# Patient Record
Sex: Female | Born: 1940 | Race: Black or African American | Hispanic: No | Smoking: Former smoker
Health system: Southern US, Community
[De-identification: ages and names within clinical notes are randomized; demographics above are authoritative.]

## PROBLEM LIST (undated history)

## (undated) DIAGNOSIS — M1990 Unspecified osteoarthritis, unspecified site: Secondary | ICD-10-CM

## (undated) DIAGNOSIS — D649 Anemia, unspecified: Secondary | ICD-10-CM

## (undated) DIAGNOSIS — N189 Chronic kidney disease, unspecified: Secondary | ICD-10-CM

## (undated) DIAGNOSIS — D472 Monoclonal gammopathy: Secondary | ICD-10-CM

## (undated) DIAGNOSIS — E785 Hyperlipidemia, unspecified: Secondary | ICD-10-CM

## (undated) DIAGNOSIS — N2581 Secondary hyperparathyroidism of renal origin: Secondary | ICD-10-CM

## (undated) DIAGNOSIS — M109 Gout, unspecified: Secondary | ICD-10-CM

## (undated) DIAGNOSIS — E669 Obesity, unspecified: Secondary | ICD-10-CM

## (undated) DIAGNOSIS — I10 Essential (primary) hypertension: Secondary | ICD-10-CM

## (undated) DIAGNOSIS — K219 Gastro-esophageal reflux disease without esophagitis: Secondary | ICD-10-CM

## (undated) DIAGNOSIS — E039 Hypothyroidism, unspecified: Secondary | ICD-10-CM

## (undated) HISTORY — DX: Unspecified osteoarthritis, unspecified site: M19.90

## (undated) HISTORY — DX: Chronic kidney disease, unspecified: N18.9

## (undated) HISTORY — DX: Gastro-esophageal reflux disease without esophagitis: K21.9

## (undated) HISTORY — PX: COLONOSCOPY W/ POLYPECTOMY: SHX1380

## (undated) HISTORY — DX: Anemia, unspecified: D64.9

## (undated) HISTORY — PX: BACK SURGERY: SHX140

## (undated) HISTORY — DX: Essential (primary) hypertension: I10

## (undated) HISTORY — DX: Obesity, unspecified: E66.9

## (undated) HISTORY — DX: Secondary hyperparathyroidism of renal origin: N25.81

## (undated) HISTORY — DX: Hypothyroidism, unspecified: E03.9

## (undated) HISTORY — DX: Gout, unspecified: M10.9

## (undated) HISTORY — DX: Monoclonal gammopathy: D47.2

## (undated) HISTORY — DX: Hyperlipidemia, unspecified: E78.5

---

## 2004-12-20 ENCOUNTER — Ambulatory Visit: Payer: Self-pay | Admitting: Oncology

## 2005-04-04 ENCOUNTER — Ambulatory Visit: Payer: Self-pay | Admitting: Oncology

## 2005-07-19 ENCOUNTER — Ambulatory Visit (HOSPITAL_COMMUNITY): Admission: RE | Admit: 2005-07-19 | Discharge: 2005-07-19 | Payer: Self-pay | Admitting: Internal Medicine

## 2005-09-09 ENCOUNTER — Ambulatory Visit: Payer: Self-pay | Admitting: Oncology

## 2005-09-11 LAB — CBC WITH DIFFERENTIAL/PLATELET
BASO%: 0.4 % (ref 0.0–2.0)
Basophils Absolute: 0 10*3/uL (ref 0.0–0.1)
Eosinophils Absolute: 0.4 10*3/uL (ref 0.0–0.5)
HCT: 28.9 % — ABNORMAL LOW (ref 34.8–46.6)
HGB: 9.8 g/dL — ABNORMAL LOW (ref 11.6–15.9)
LYMPH%: 34.3 % (ref 14.0–48.0)
MCHC: 34 g/dL (ref 32.0–36.0)
MONO#: 0.6 10*3/uL (ref 0.1–0.9)
NEUT#: 3.8 10*3/uL (ref 1.5–6.5)
NEUT%: 52 % (ref 39.6–76.8)
Platelets: 277 10*3/uL (ref 145–400)
WBC: 7.4 10*3/uL (ref 3.9–10.0)
lymph#: 2.5 10*3/uL (ref 0.9–3.3)

## 2005-11-25 ENCOUNTER — Ambulatory Visit: Payer: Self-pay | Admitting: Oncology

## 2005-11-27 LAB — CBC WITH DIFFERENTIAL/PLATELET
Basophils Absolute: 0 10*3/uL (ref 0.0–0.1)
EOS%: 5.9 % (ref 0.0–7.0)
HCT: 31.4 % — ABNORMAL LOW (ref 34.8–46.6)
HGB: 10.6 g/dL — ABNORMAL LOW (ref 11.6–15.9)
LYMPH%: 36.4 % (ref 14.0–48.0)
MCH: 31.3 pg (ref 26.0–34.0)
MCV: 93.2 fL (ref 81.0–101.0)
MONO%: 8.4 % (ref 0.0–13.0)
NEUT%: 49 % (ref 39.6–76.8)

## 2005-12-02 LAB — COMPREHENSIVE METABOLIC PANEL
ALT: 21 U/L (ref 0–40)
CO2: 26 mEq/L (ref 19–32)
Calcium: 9.5 mg/dL (ref 8.4–10.5)
Chloride: 107 mEq/L (ref 96–112)
Creatinine, Ser: 2.05 mg/dL — ABNORMAL HIGH (ref 0.40–1.20)
Glucose, Bld: 116 mg/dL — ABNORMAL HIGH (ref 70–99)
Sodium: 144 mEq/L (ref 135–145)
Total Bilirubin: 0.4 mg/dL (ref 0.3–1.2)
Total Protein: 7.9 g/dL (ref 6.0–8.3)

## 2005-12-02 LAB — LACTATE DEHYDROGENASE: LDH: 213 U/L (ref 94–250)

## 2005-12-02 LAB — SPEP & IFE WITH QIG
Albumin ELP: 52.7 % — ABNORMAL LOW (ref 55.8–66.1)
Alpha-1-Globulin: 4.2 % (ref 2.9–4.9)
Alpha-2-Globulin: 12.4 % — ABNORMAL HIGH (ref 7.1–11.8)
Beta 2: 4.4 % (ref 3.2–6.5)
Beta Globulin: 5.5 % (ref 4.7–7.2)
Gamma Globulin: 20.8 % — ABNORMAL HIGH (ref 11.1–18.8)
IgM, Serum: 147 mg/dL (ref 60–263)

## 2005-12-02 LAB — BETA 2 MICROGLOBULIN, SERUM: Beta-2 Microglobulin: 2.51 mg/L — ABNORMAL HIGH (ref 1.01–1.73)

## 2005-12-02 LAB — KAPPA/LAMBDA LIGHT CHAINS: Kappa:Lambda Ratio: 0.93 (ref 0.26–1.65)

## 2006-05-29 ENCOUNTER — Ambulatory Visit: Payer: Self-pay | Admitting: Oncology

## 2006-06-03 LAB — CBC WITH DIFFERENTIAL/PLATELET
Basophils Absolute: 0 10*3/uL (ref 0.0–0.1)
EOS%: 7.2 % — ABNORMAL HIGH (ref 0.0–7.0)
HCT: 32 % — ABNORMAL LOW (ref 34.8–46.6)
HGB: 11 g/dL — ABNORMAL LOW (ref 11.6–15.9)
LYMPH%: 38.4 % (ref 14.0–48.0)
MCH: 31.2 pg (ref 26.0–34.0)
MCHC: 34.2 g/dL (ref 32.0–36.0)
NEUT%: 46.1 % (ref 39.6–76.8)
Platelets: 255 10*3/uL (ref 145–400)
lymph#: 2.5 10*3/uL (ref 0.9–3.3)

## 2006-06-05 LAB — COMPREHENSIVE METABOLIC PANEL
AST: 24 U/L (ref 0–37)
BUN: 39 mg/dL — ABNORMAL HIGH (ref 6–23)
CO2: 23 mEq/L (ref 19–32)
Calcium: 9.7 mg/dL (ref 8.4–10.5)
Chloride: 106 mEq/L (ref 96–112)
Creatinine, Ser: 2.11 mg/dL — ABNORMAL HIGH (ref 0.40–1.20)
Total Bilirubin: 0.4 mg/dL (ref 0.3–1.2)

## 2006-06-05 LAB — SPEP & IFE WITH QIG
Gamma Globulin: 21.4 % — ABNORMAL HIGH (ref 11.1–18.8)
IgG (Immunoglobin G), Serum: 1790 mg/dL — ABNORMAL HIGH (ref 694–1618)
IgM, Serum: 149 mg/dL (ref 60–263)
M-Spike, %: 0.85 g/dL
Total Protein, Serum Electrophoresis: 8.1 g/dL (ref 6.0–8.3)

## 2006-10-03 ENCOUNTER — Ambulatory Visit: Payer: Self-pay | Admitting: Oncology

## 2006-10-08 LAB — CBC WITH DIFFERENTIAL/PLATELET
BASO%: 0.5 % (ref 0.0–2.0)
LYMPH%: 29.6 % (ref 14.0–48.0)
MCHC: 34.1 g/dL (ref 32.0–36.0)
MONO#: 0.5 10*3/uL (ref 0.1–0.9)
RBC: 3.2 10*6/uL — ABNORMAL LOW (ref 3.70–5.32)
RDW: 13.9 % (ref 11.3–14.5)
WBC: 7.2 10*3/uL (ref 3.9–10.0)
lymph#: 2.1 10*3/uL (ref 0.9–3.3)

## 2006-10-10 LAB — COMPREHENSIVE METABOLIC PANEL
ALT: 18 U/L (ref 0–35)
CO2: 24 mEq/L (ref 19–32)
Chloride: 110 mEq/L (ref 96–112)
Potassium: 4.7 mEq/L (ref 3.5–5.3)
Sodium: 143 mEq/L (ref 135–145)
Total Bilirubin: 0.5 mg/dL (ref 0.3–1.2)
Total Protein: 7.8 g/dL (ref 6.0–8.3)

## 2006-10-10 LAB — SPEP & IFE WITH QIG
Albumin ELP: 53.4 % — ABNORMAL LOW (ref 55.8–66.1)
Alpha-1-Globulin: 4 % (ref 2.9–4.9)
Alpha-2-Globulin: 12.1 % — ABNORMAL HIGH (ref 7.1–11.8)
Beta 2: 4.6 % (ref 3.2–6.5)
Beta Globulin: 5 % (ref 4.7–7.2)
Gamma Globulin: 20.9 % — ABNORMAL HIGH (ref 11.1–18.8)

## 2006-10-10 LAB — IRON AND TIBC
%SAT: 26 % (ref 20–55)
Iron: 73 ug/dL (ref 42–145)
TIBC: 285 ug/dL (ref 250–470)
UIBC: 212 ug/dL

## 2006-10-10 LAB — LACTATE DEHYDROGENASE: LDH: 201 U/L (ref 94–250)

## 2006-12-19 ENCOUNTER — Ambulatory Visit (HOSPITAL_COMMUNITY): Admission: RE | Admit: 2006-12-19 | Discharge: 2006-12-19 | Payer: Self-pay | Admitting: Internal Medicine

## 2007-05-19 ENCOUNTER — Ambulatory Visit: Payer: Self-pay | Admitting: Oncology

## 2007-05-21 LAB — COMPREHENSIVE METABOLIC PANEL
ALT: 17 U/L (ref 0–35)
AST: 21 U/L (ref 0–37)
Alkaline Phosphatase: 57 U/L (ref 39–117)
BUN: 41 mg/dL — ABNORMAL HIGH (ref 6–23)
Creatinine, Ser: 2.79 mg/dL — ABNORMAL HIGH (ref 0.40–1.20)
Potassium: 4.8 mEq/L (ref 3.5–5.3)

## 2007-05-21 LAB — CBC WITH DIFFERENTIAL/PLATELET
BASO%: 0.5 % (ref 0.0–2.0)
Basophils Absolute: 0 10*3/uL (ref 0.0–0.1)
EOS%: 4.2 % (ref 0.0–7.0)
HGB: 10.9 g/dL — ABNORMAL LOW (ref 11.6–15.9)
MCH: 32.6 pg (ref 26.0–34.0)
MCHC: 34.7 g/dL (ref 32.0–36.0)
MCV: 94 fL (ref 81.0–101.0)
MONO%: 7.2 % (ref 0.0–13.0)
RDW: 14.1 % (ref 11.3–14.5)

## 2007-11-18 ENCOUNTER — Ambulatory Visit: Payer: Self-pay | Admitting: Oncology

## 2007-11-24 LAB — CBC WITH DIFFERENTIAL/PLATELET
BASO%: 0.2 % (ref 0.0–2.0)
EOS%: 6.2 % (ref 0.0–7.0)
HCT: 30 % — ABNORMAL LOW (ref 34.8–46.6)
MCH: 32.5 pg (ref 26.0–34.0)
MCHC: 34 g/dL (ref 32.0–36.0)
MONO#: 0.5 10*3/uL (ref 0.1–0.9)
NEUT%: 50.1 % (ref 39.6–76.8)
RBC: 3.14 10*6/uL — ABNORMAL LOW (ref 3.70–5.32)
WBC: 6.5 10*3/uL (ref 3.9–10.0)
lymph#: 2.3 10*3/uL (ref 0.9–3.3)

## 2007-11-26 LAB — COMPREHENSIVE METABOLIC PANEL
ALT: 21 U/L (ref 0–35)
AST: 21 U/L (ref 0–37)
CO2: 23 mEq/L (ref 19–32)
Calcium: 10 mg/dL (ref 8.4–10.5)
Chloride: 109 mEq/L (ref 96–112)
Creatinine, Ser: 2.67 mg/dL — ABNORMAL HIGH (ref 0.40–1.20)
Sodium: 145 mEq/L (ref 135–145)
Total Bilirubin: 0.5 mg/dL (ref 0.3–1.2)
Total Protein: 8 g/dL (ref 6.0–8.3)

## 2007-11-26 LAB — SPEP & IFE WITH QIG
Albumin ELP: 54.5 % — ABNORMAL LOW (ref 55.8–66.1)
Beta 2: 4.7 % (ref 3.2–6.5)
Beta Globulin: 5 % (ref 4.7–7.2)
Gamma Globulin: 20.9 % — ABNORMAL HIGH (ref 11.1–18.8)
IgA: 134 mg/dL (ref 68–378)
IgG (Immunoglobin G), Serum: 1830 mg/dL — ABNORMAL HIGH (ref 694–1618)
IgM, Serum: 138 mg/dL (ref 60–263)
M-Spike, %: 0.82 g/dL

## 2007-11-26 LAB — KAPPA/LAMBDA LIGHT CHAINS
Kappa free light chain: 3.2 mg/dL — ABNORMAL HIGH (ref 0.33–1.94)
Kappa:Lambda Ratio: 0.3 (ref 0.26–1.65)
Lambda Free Lght Chn: 10.7 mg/dL — ABNORMAL HIGH (ref 0.57–2.63)

## 2007-11-26 LAB — IRON AND TIBC: %SAT: 29 % (ref 20–55)

## 2008-01-14 ENCOUNTER — Ambulatory Visit (HOSPITAL_COMMUNITY): Admission: RE | Admit: 2008-01-14 | Discharge: 2008-01-14 | Payer: Self-pay | Admitting: Internal Medicine

## 2008-05-20 ENCOUNTER — Ambulatory Visit: Payer: Self-pay | Admitting: Oncology

## 2008-05-24 LAB — CBC WITH DIFFERENTIAL/PLATELET
BASO%: 0.4 % (ref 0.0–2.0)
Basophils Absolute: 0 10*3/uL (ref 0.0–0.1)
HCT: 29.8 % — ABNORMAL LOW (ref 34.8–46.6)
HGB: 9.9 g/dL — ABNORMAL LOW (ref 11.6–15.9)
LYMPH%: 29.5 % (ref 14.0–49.7)
MCH: 31.8 pg (ref 25.1–34.0)
MCHC: 33.2 g/dL (ref 31.5–36.0)
MONO#: 0.7 10*3/uL (ref 0.1–0.9)
NEUT%: 57 % (ref 38.4–76.8)
Platelets: 231 10*3/uL (ref 145–400)
WBC: 8.7 10*3/uL (ref 3.9–10.3)

## 2008-05-26 LAB — COMPREHENSIVE METABOLIC PANEL
ALT: 24 U/L (ref 0–35)
BUN: 56 mg/dL — ABNORMAL HIGH (ref 6–23)
CO2: 25 mEq/L (ref 19–32)
Calcium: 10 mg/dL (ref 8.4–10.5)
Creatinine, Ser: 3.16 mg/dL — ABNORMAL HIGH (ref 0.40–1.20)
Total Bilirubin: 0.3 mg/dL (ref 0.3–1.2)

## 2008-05-26 LAB — SPEP & IFE WITH QIG
Alpha-1-Globulin: 4 % (ref 2.9–4.9)
Beta 2: 4.3 % (ref 3.2–6.5)
Gamma Globulin: 21 % — ABNORMAL HIGH (ref 11.1–18.8)
IgA: 126 mg/dL (ref 68–378)
IgG (Immunoglobin G), Serum: 1690 mg/dL — ABNORMAL HIGH (ref 694–1618)
IgM, Serum: 133 mg/dL (ref 60–263)
M-Spike, %: 0.87 g/dL

## 2008-05-26 LAB — KAPPA/LAMBDA LIGHT CHAINS
Kappa:Lambda Ratio: 0.32 (ref 0.26–1.65)
Lambda Free Lght Chn: 11.8 mg/dL — ABNORMAL HIGH (ref 0.57–2.63)

## 2008-08-25 ENCOUNTER — Ambulatory Visit: Payer: Self-pay | Admitting: Oncology

## 2008-08-30 ENCOUNTER — Ambulatory Visit (HOSPITAL_COMMUNITY): Admission: RE | Admit: 2008-08-30 | Discharge: 2008-08-30 | Payer: Self-pay | Admitting: Oncology

## 2008-08-30 LAB — CBC WITH DIFFERENTIAL/PLATELET
Basophils Absolute: 0 10*3/uL (ref 0.0–0.1)
Eosinophils Absolute: 0.4 10*3/uL (ref 0.0–0.5)
HGB: 9.9 g/dL — ABNORMAL LOW (ref 11.6–15.9)
MCV: 93.8 fL (ref 79.5–101.0)
NEUT#: 4.4 10*3/uL (ref 1.5–6.5)
RDW: 13.5 % (ref 11.2–14.5)
lymph#: 2.8 10*3/uL (ref 0.9–3.3)

## 2008-09-01 LAB — KAPPA/LAMBDA LIGHT CHAINS
Kappa:Lambda Ratio: 0.39 (ref 0.26–1.65)
Lambda Free Lght Chn: 9.23 mg/dL — ABNORMAL HIGH (ref 0.57–2.63)

## 2008-09-01 LAB — COMPREHENSIVE METABOLIC PANEL
Albumin: 4.1 g/dL (ref 3.5–5.2)
BUN: 40 mg/dL — ABNORMAL HIGH (ref 6–23)
CO2: 27 mEq/L (ref 19–32)
Calcium: 9.5 mg/dL (ref 8.4–10.5)
Chloride: 106 mEq/L (ref 96–112)
Glucose, Bld: 108 mg/dL — ABNORMAL HIGH (ref 70–99)
Potassium: 4.4 mEq/L (ref 3.5–5.3)

## 2008-09-01 LAB — SPEP & IFE WITH QIG
Alpha-1-Globulin: 4.5 % (ref 2.9–4.9)
Alpha-2-Globulin: 12.8 % — ABNORMAL HIGH (ref 7.1–11.8)
Beta 2: 3.9 % (ref 3.2–6.5)
Gamma Globulin: 20.1 % — ABNORMAL HIGH (ref 11.1–18.8)

## 2008-09-12 ENCOUNTER — Ambulatory Visit (HOSPITAL_COMMUNITY): Admission: RE | Admit: 2008-09-12 | Discharge: 2008-09-12 | Payer: Self-pay | Admitting: Oncology

## 2008-09-12 ENCOUNTER — Encounter: Payer: Self-pay | Admitting: Oncology

## 2008-09-12 ENCOUNTER — Ambulatory Visit: Payer: Self-pay | Admitting: Oncology

## 2008-09-20 LAB — COMPREHENSIVE METABOLIC PANEL
ALT: 27 U/L (ref 0–35)
Albumin: 4.5 g/dL (ref 3.5–5.2)
Alkaline Phosphatase: 63 U/L (ref 39–117)
CO2: 23 mEq/L (ref 19–32)
Glucose, Bld: 114 mg/dL — ABNORMAL HIGH (ref 70–99)
Potassium: 4.2 mEq/L (ref 3.5–5.3)
Sodium: 143 mEq/L (ref 135–145)
Total Bilirubin: 0.4 mg/dL (ref 0.3–1.2)
Total Protein: 8 g/dL (ref 6.0–8.3)

## 2008-09-20 LAB — CBC WITH DIFFERENTIAL/PLATELET
BASO%: 0.5 % (ref 0.0–2.0)
Eosinophils Absolute: 0.3 10*3/uL (ref 0.0–0.5)
LYMPH%: 32.2 % (ref 14.0–49.7)
MCHC: 33.2 g/dL (ref 31.5–36.0)
MONO#: 0.5 10*3/uL (ref 0.1–0.9)
MONO%: 7.9 % (ref 0.0–14.0)
NEUT#: 3.7 10*3/uL (ref 1.5–6.5)
RBC: 3.36 10*6/uL — ABNORMAL LOW (ref 3.70–5.45)
RDW: 13.9 % (ref 11.2–14.5)
WBC: 6.7 10*3/uL (ref 3.9–10.3)

## 2008-10-14 ENCOUNTER — Ambulatory Visit: Payer: Self-pay | Admitting: Oncology

## 2008-10-18 LAB — CBC WITH DIFFERENTIAL/PLATELET
Basophils Absolute: 0 10*3/uL (ref 0.0–0.1)
EOS%: 5.4 % (ref 0.0–7.0)
Eosinophils Absolute: 0.4 10*3/uL (ref 0.0–0.5)
HGB: 11.4 g/dL — ABNORMAL LOW (ref 11.6–15.9)
LYMPH%: 33.8 % (ref 14.0–49.7)
MCH: 31.5 pg (ref 25.1–34.0)
MCV: 94.9 fL (ref 79.5–101.0)
MONO%: 7.2 % (ref 0.0–14.0)
NEUT%: 52.9 % (ref 38.4–76.8)
Platelets: 259 10*3/uL (ref 145–400)
RDW: 15 % — ABNORMAL HIGH (ref 11.2–14.5)

## 2008-10-20 LAB — SPEP & IFE WITH QIG
Albumin ELP: 54.1 % — ABNORMAL LOW (ref 55.8–66.1)
Alpha-2-Globulin: 11.3 % (ref 7.1–11.8)
Beta 2: 4.5 % (ref 3.2–6.5)
Beta Globulin: 5 % (ref 4.7–7.2)
IgA: 131 mg/dL (ref 68–378)
IgG (Immunoglobin G), Serum: 1770 mg/dL — ABNORMAL HIGH (ref 694–1618)

## 2008-10-20 LAB — KAPPA/LAMBDA LIGHT CHAINS
Kappa free light chain: 2.86 mg/dL — ABNORMAL HIGH (ref 0.33–1.94)
Lambda Free Lght Chn: 9.59 mg/dL — ABNORMAL HIGH (ref 0.57–2.63)

## 2008-11-11 ENCOUNTER — Ambulatory Visit: Payer: Self-pay | Admitting: Oncology

## 2008-11-15 LAB — CBC WITH DIFFERENTIAL/PLATELET
BASO%: 0.4 % (ref 0.0–2.0)
EOS%: 5.6 % (ref 0.0–7.0)
LYMPH%: 41.7 % (ref 14.0–49.7)
MCH: 31.2 pg (ref 25.1–34.0)
MCHC: 33.2 g/dL (ref 31.5–36.0)
MCV: 94.1 fL (ref 79.5–101.0)
MONO%: 8 % (ref 0.0–14.0)
NEUT#: 3.4 10*3/uL (ref 1.5–6.5)
Platelets: 236 10*3/uL (ref 145–400)
RBC: 4.02 10*6/uL (ref 3.70–5.45)
RDW: 15.9 % — ABNORMAL HIGH (ref 11.2–14.5)

## 2008-12-13 ENCOUNTER — Ambulatory Visit: Payer: Self-pay | Admitting: Oncology

## 2008-12-13 LAB — CBC WITH DIFFERENTIAL/PLATELET
EOS%: 6.2 % (ref 0.0–7.0)
MCH: 31.3 pg (ref 25.1–34.0)
MCHC: 33.6 g/dL (ref 31.5–36.0)
MCV: 93.1 fL (ref 79.5–101.0)
MONO%: 8.4 % (ref 0.0–14.0)
RBC: 3.68 10*6/uL — ABNORMAL LOW (ref 3.70–5.45)
RDW: 16.5 % — ABNORMAL HIGH (ref 11.2–14.5)

## 2009-01-06 ENCOUNTER — Ambulatory Visit: Payer: Self-pay | Admitting: Oncology

## 2009-01-10 LAB — CBC WITH DIFFERENTIAL/PLATELET
Basophils Absolute: 0 10*3/uL (ref 0.0–0.1)
EOS%: 5.9 % (ref 0.0–7.0)
HCT: 37 % (ref 34.8–46.6)
HGB: 12.4 g/dL (ref 11.6–15.9)
MCH: 31.5 pg (ref 25.1–34.0)
MCV: 94.3 fL (ref 79.5–101.0)
MONO%: 8.1 % (ref 0.0–14.0)
NEUT%: 46 % (ref 38.4–76.8)
RDW: 17.5 % — ABNORMAL HIGH (ref 11.2–14.5)

## 2009-02-02 ENCOUNTER — Ambulatory Visit: Payer: Self-pay | Admitting: Oncology

## 2009-02-07 LAB — CBC WITH DIFFERENTIAL/PLATELET
Basophils Absolute: 0 10*3/uL (ref 0.0–0.1)
HCT: 33.4 % — ABNORMAL LOW (ref 34.8–46.6)
HGB: 11.1 g/dL — ABNORMAL LOW (ref 11.6–15.9)
MCHC: 33.2 g/dL (ref 31.5–36.0)
MCV: 95.1 fL (ref 79.5–101.0)
MONO#: 0.5 10*3/uL (ref 0.1–0.9)
NEUT#: 3.7 10*3/uL (ref 1.5–6.5)
RBC: 3.51 10*6/uL — ABNORMAL LOW (ref 3.70–5.45)
RDW: 17.1 % — ABNORMAL HIGH (ref 11.2–14.5)

## 2009-02-10 LAB — SPEP & IFE WITH QIG
Albumin ELP: 54 % — ABNORMAL LOW (ref 55.8–66.1)
Beta 2: 4.4 % (ref 3.2–6.5)
Beta Globulin: 4.7 % (ref 4.7–7.2)
Gamma Globulin: 20.3 % — ABNORMAL HIGH (ref 11.1–18.8)
IgA: 125 mg/dL (ref 68–378)

## 2009-02-10 LAB — COMPREHENSIVE METABOLIC PANEL
BUN: 34 mg/dL — ABNORMAL HIGH (ref 6–23)
CO2: 27 mEq/L (ref 19–32)
Creatinine, Ser: 2.39 mg/dL — ABNORMAL HIGH (ref 0.40–1.20)
Glucose, Bld: 115 mg/dL — ABNORMAL HIGH (ref 70–99)
Sodium: 145 mEq/L (ref 135–145)
Total Bilirubin: 0.4 mg/dL (ref 0.3–1.2)
Total Protein: 7.7 g/dL (ref 6.0–8.3)

## 2009-02-10 LAB — KAPPA/LAMBDA LIGHT CHAINS
Kappa free light chain: 3.11 mg/dL — ABNORMAL HIGH (ref 0.33–1.94)
Kappa:Lambda Ratio: 0.29 (ref 0.26–1.65)
Lambda Free Lght Chn: 10.8 mg/dL — ABNORMAL HIGH (ref 0.57–2.63)

## 2009-06-05 ENCOUNTER — Ambulatory Visit: Payer: Self-pay | Admitting: Oncology

## 2009-06-07 LAB — COMPREHENSIVE METABOLIC PANEL
Albumin: 4.5 g/dL (ref 3.5–5.2)
BUN: 35 mg/dL — ABNORMAL HIGH (ref 6–23)
Calcium: 10.3 mg/dL (ref 8.4–10.5)
Chloride: 103 mEq/L (ref 96–112)
Glucose, Bld: 115 mg/dL — ABNORMAL HIGH (ref 70–99)

## 2009-06-07 LAB — CBC WITH DIFFERENTIAL/PLATELET
BASO%: 0.9 % (ref 0.0–2.0)
Basophils Absolute: 0.1 10*3/uL (ref 0.0–0.1)
Eosinophils Absolute: 0.5 10*3/uL (ref 0.0–0.5)
LYMPH%: 40.2 % (ref 14.0–49.7)
MCV: 96.6 fL (ref 79.5–101.0)
MONO#: 0.7 10*3/uL (ref 0.1–0.9)
MONO%: 7.7 % (ref 0.0–14.0)
NEUT%: 45.9 % (ref 38.4–76.8)
RBC: 3.32 10*6/uL — ABNORMAL LOW (ref 3.70–5.45)
RDW: 14.5 % (ref 11.2–14.5)
lymph#: 3.5 10*3/uL — ABNORMAL HIGH (ref 0.9–3.3)

## 2009-08-10 ENCOUNTER — Ambulatory Visit: Payer: Self-pay | Admitting: Oncology

## 2009-08-11 LAB — CBC WITH DIFFERENTIAL/PLATELET
BASO%: 1.8 % (ref 0.0–2.0)
Basophils Absolute: 0.2 10*3/uL — ABNORMAL HIGH (ref 0.0–0.1)
Platelets: 246 10*3/uL (ref 145–400)

## 2009-08-16 LAB — COMPREHENSIVE METABOLIC PANEL
ALT: 24 U/L (ref 0–35)
AST: 26 U/L (ref 0–37)
Albumin: 4.4 g/dL (ref 3.5–5.2)
BUN: 49 mg/dL — ABNORMAL HIGH (ref 6–23)
CO2: 24 mEq/L (ref 19–32)
Calcium: 10.4 mg/dL (ref 8.4–10.5)
Chloride: 104 mEq/L (ref 96–112)
Potassium: 4.3 mEq/L (ref 3.5–5.3)
Sodium: 141 mEq/L (ref 135–145)
Total Bilirubin: 0.4 mg/dL (ref 0.3–1.2)
Total Protein: 7.5 g/dL (ref 6.0–8.3)

## 2009-08-16 LAB — SPEP & IFE WITH QIG
Alpha-1-Globulin: 4.1 % (ref 2.9–4.9)
Beta 2: 3.6 % (ref 3.2–6.5)
Gamma Globulin: 20.6 % — ABNORMAL HIGH (ref 11.1–18.8)
IgA: 118 mg/dL (ref 68–378)
IgM, Serum: 126 mg/dL (ref 60–263)
Total Protein, Serum Electrophoresis: 7.5 g/dL (ref 6.0–8.3)

## 2010-01-12 ENCOUNTER — Ambulatory Visit: Payer: Self-pay | Admitting: Oncology

## 2010-01-18 LAB — CBC WITH DIFFERENTIAL/PLATELET
BASO%: 1 % (ref 0.0–2.0)
Basophils Absolute: 0.1 10*3/uL (ref 0.0–0.1)
Eosinophils Absolute: 0.4 10*3/uL (ref 0.0–0.5)
HCT: 31.2 % — ABNORMAL LOW (ref 34.8–46.6)
HGB: 10.8 g/dL — ABNORMAL LOW (ref 11.6–15.9)
LYMPH%: 32 % (ref 14.0–49.7)
MONO#: 0.4 10*3/uL (ref 0.1–0.9)
MONO%: 5.5 % (ref 0.0–14.0)
NEUT#: 4.2 10*3/uL (ref 1.5–6.5)
WBC: 7.4 10*3/uL (ref 3.9–10.3)

## 2010-01-22 LAB — SPEP & IFE WITH QIG
Albumin ELP: 52.6 % — ABNORMAL LOW (ref 55.8–66.1)
Alpha-1-Globulin: 4.3 % (ref 2.9–4.9)
IgA: 122 mg/dL (ref 68–378)
IgM, Serum: 121 mg/dL (ref 60–263)
M-Spike, %: 1.04 g/dL
Total Protein, Serum Electrophoresis: 8.7 g/dL — ABNORMAL HIGH (ref 6.0–8.3)

## 2010-01-22 LAB — KAPPA/LAMBDA LIGHT CHAINS: Kappa free light chain: 3.77 mg/dL — ABNORMAL HIGH (ref 0.33–1.94)

## 2010-01-22 LAB — COMPREHENSIVE METABOLIC PANEL
ALT: 23 U/L (ref 0–35)
AST: 27 U/L (ref 0–37)
Albumin: 5.2 g/dL (ref 3.5–5.2)
Calcium: 10.4 mg/dL (ref 8.4–10.5)
Potassium: 4.8 mEq/L (ref 3.5–5.3)
Sodium: 142 mEq/L (ref 135–145)

## 2010-03-22 ENCOUNTER — Ambulatory Visit (HOSPITAL_COMMUNITY)
Admission: RE | Admit: 2010-03-22 | Discharge: 2010-03-22 | Payer: Self-pay | Source: Home / Self Care | Attending: Internal Medicine | Admitting: Internal Medicine

## 2010-04-08 ENCOUNTER — Encounter: Payer: Self-pay | Admitting: Internal Medicine

## 2010-06-25 LAB — DIFFERENTIAL
Basophils Relative: 0 % (ref 0–1)
Lymphs Abs: 2.1 10*3/uL (ref 0.7–4.0)
Monocytes Relative: 8 % (ref 3–12)
Neutrophils Relative %: 60 % (ref 43–77)

## 2010-06-25 LAB — CHROMOSOME ANALYSIS, BONE MARROW

## 2010-06-25 LAB — BONE MARROW EXAM

## 2010-06-25 LAB — CBC
Hemoglobin: 10.1 g/dL — ABNORMAL LOW (ref 12.0–15.0)
MCV: 96.7 fL (ref 78.0–100.0)
Platelets: 223 10*3/uL (ref 150–400)

## 2010-07-19 ENCOUNTER — Other Ambulatory Visit: Payer: Self-pay | Admitting: Medical

## 2010-07-19 ENCOUNTER — Encounter (HOSPITAL_BASED_OUTPATIENT_CLINIC_OR_DEPARTMENT_OTHER): Payer: Medicare Other | Admitting: Oncology

## 2010-07-19 DIAGNOSIS — D649 Anemia, unspecified: Secondary | ICD-10-CM

## 2010-07-19 DIAGNOSIS — N189 Chronic kidney disease, unspecified: Secondary | ICD-10-CM

## 2010-07-19 DIAGNOSIS — N289 Disorder of kidney and ureter, unspecified: Secondary | ICD-10-CM

## 2010-07-19 DIAGNOSIS — D472 Monoclonal gammopathy: Secondary | ICD-10-CM

## 2010-07-19 LAB — CBC WITH DIFFERENTIAL/PLATELET
EOS%: 4.9 % (ref 0.0–7.0)
Eosinophils Absolute: 0.4 10*3/uL (ref 0.0–0.5)
HCT: 27.5 % — ABNORMAL LOW (ref 34.8–46.6)
MCHC: 33.9 g/dL (ref 31.5–36.0)
MONO#: 0.6 10*3/uL (ref 0.1–0.9)
NEUT%: 56.2 % (ref 38.4–76.8)
RDW: 13.6 % (ref 11.2–14.5)
lymph#: 2.6 10*3/uL (ref 0.9–3.3)

## 2010-07-23 LAB — COMPREHENSIVE METABOLIC PANEL
ALT: 18 U/L (ref 0–35)
AST: 22 U/L (ref 0–37)
Calcium: 10.1 mg/dL (ref 8.4–10.5)
Sodium: 144 mEq/L (ref 135–145)
Total Protein: 7.9 g/dL (ref 6.0–8.3)

## 2010-07-23 LAB — KAPPA/LAMBDA LIGHT CHAINS
Kappa free light chain: 3.92 mg/dL — ABNORMAL HIGH (ref 0.33–1.94)
Kappa:Lambda Ratio: 0.27 (ref 0.26–1.65)
Lambda Free Lght Chn: 14.5 mg/dL — ABNORMAL HIGH (ref 0.57–2.63)

## 2010-07-23 LAB — SPEP & IFE WITH QIG
Alpha-1-Globulin: 4 % (ref 2.9–4.9)
Beta Globulin: 4.7 % (ref 4.7–7.2)
IgA: 108 mg/dL (ref 68–378)
IgG (Immunoglobin G), Serum: 1870 mg/dL — ABNORMAL HIGH (ref 694–1618)
IgM, Serum: 109 mg/dL (ref 60–263)
Total Protein, Serum Electrophoresis: 7.9 g/dL (ref 6.0–8.3)

## 2010-07-31 NOTE — Op Note (Signed)
[name], Bonnie Alvarez           ACCOUNT NO.:  [id number]   MEDICAL RECORD NO.:  [id number]          PATIENT TYPE:  AMB   LOCATION:  [facility]                         FACILITY:  [facility]   PHYSICIAN:  Firas N. Shadad        DATE OF BIRTH:  1941/03/17   DATE OF PROCEDURE:  09/12/2008  DATE OF DISCHARGE:                               OPERATIVE REPORT   PROCEDURE PERFORMED:  Bone marrow biopsy.   INDICATION FOR PROCEDURE:  This is a 70 year old female with anemia and  monoclonal protein.  Bone marrow biopsy is to rule out a plasma cell  disorder.   DESCRIPTION OF PROCEDURE:  The patient was brought into short stay at  [facility] and informed consent was obtained at this time.  That was after the risks and benefits of the procedure were described  fully to the patient.  The patient subsequently lay on her right side  exposing her left iliac crest.  The skin was prepped and draped in a  sterile fashion using Betadine and sterile towels.  The skin and  subcutaneous tissue and periosteum was anesthetized adequately.  Using  11 blade scalpel, small incision was made at this time.  Using the  aspirate needle, aspirate was obtained without any difficulty.  Flow  cytogenetics were obtained on the sample as well.  Using the Jamshidi  needle, biopsy was obtained without any difficulty.  The patient  tolerated procedure well.  No complications.  The patient was asked to  lay flat for the next 45 minutes and she will follow-up outpatient  clinic to discuss the results in the next week or so.           ______________________________  Blenda Nicely. [facility]  Electronically Signed     FNS/MEDQ  D:  09/12/2008  T:  09/12/2008  Job:  [id number]

## 2010-11-20 ENCOUNTER — Encounter (HOSPITAL_BASED_OUTPATIENT_CLINIC_OR_DEPARTMENT_OTHER): Payer: Medicare Other | Admitting: Oncology

## 2010-11-20 ENCOUNTER — Other Ambulatory Visit: Payer: Self-pay | Admitting: Oncology

## 2010-11-20 DIAGNOSIS — N289 Disorder of kidney and ureter, unspecified: Secondary | ICD-10-CM

## 2010-11-20 DIAGNOSIS — N189 Chronic kidney disease, unspecified: Secondary | ICD-10-CM

## 2010-11-20 DIAGNOSIS — D649 Anemia, unspecified: Secondary | ICD-10-CM

## 2010-11-20 DIAGNOSIS — D472 Monoclonal gammopathy: Secondary | ICD-10-CM

## 2010-11-20 LAB — COMPREHENSIVE METABOLIC PANEL
AST: 24 U/L (ref 0–37)
Albumin: 4.3 g/dL (ref 3.5–5.2)
Alkaline Phosphatase: 62 U/L (ref 39–117)
BUN: 61 mg/dL — ABNORMAL HIGH (ref 6–23)
Glucose, Bld: 101 mg/dL — ABNORMAL HIGH (ref 70–99)
Potassium: 4.7 mEq/L (ref 3.5–5.3)
Sodium: 140 mEq/L (ref 135–145)
Total Bilirubin: 0.4 mg/dL (ref 0.3–1.2)
Total Protein: 7.7 g/dL (ref 6.0–8.3)

## 2010-11-20 LAB — CBC WITH DIFFERENTIAL/PLATELET
Basophils Absolute: 0 10*3/uL (ref 0.0–0.1)
EOS%: 7.9 % — ABNORMAL HIGH (ref 0.0–7.0)
Eosinophils Absolute: 0.7 10*3/uL — ABNORMAL HIGH (ref 0.0–0.5)
HCT: 28.7 % — ABNORMAL LOW (ref 34.8–46.6)
HGB: 9.8 g/dL — ABNORMAL LOW (ref 11.6–15.9)
MCH: 32.5 pg (ref 25.1–34.0)
MCV: 95.6 fL (ref 79.5–101.0)
MONO%: 6.8 % (ref 0.0–14.0)
NEUT#: 4.1 10*3/uL (ref 1.5–6.5)
NEUT%: 48.5 % (ref 38.4–76.8)
RDW: 15.3 % — ABNORMAL HIGH (ref 11.2–14.5)

## 2011-03-15 ENCOUNTER — Telehealth: Payer: Self-pay | Admitting: Oncology

## 2011-03-15 NOTE — Telephone Encounter (Signed)
S/w pt today re appt for 1/4 !@ 9:45 am

## 2011-03-22 ENCOUNTER — Telehealth: Payer: Self-pay | Admitting: Oncology

## 2011-03-22 ENCOUNTER — Other Ambulatory Visit (HOSPITAL_BASED_OUTPATIENT_CLINIC_OR_DEPARTMENT_OTHER): Payer: Medicare Other | Admitting: Lab

## 2011-03-22 ENCOUNTER — Other Ambulatory Visit: Payer: Self-pay | Admitting: Medical

## 2011-03-22 ENCOUNTER — Ambulatory Visit (HOSPITAL_BASED_OUTPATIENT_CLINIC_OR_DEPARTMENT_OTHER): Payer: Medicare Other | Admitting: Oncology

## 2011-03-22 VITALS — BP 186/86 | HR 110 | Temp 98.2°F | Ht 66.0 in | Wt 190.7 lb

## 2011-03-22 DIAGNOSIS — D649 Anemia, unspecified: Secondary | ICD-10-CM

## 2011-03-22 DIAGNOSIS — N189 Chronic kidney disease, unspecified: Secondary | ICD-10-CM

## 2011-03-22 DIAGNOSIS — D472 Monoclonal gammopathy: Secondary | ICD-10-CM

## 2011-03-22 DIAGNOSIS — N289 Disorder of kidney and ureter, unspecified: Secondary | ICD-10-CM

## 2011-03-22 LAB — CBC WITH DIFFERENTIAL/PLATELET
Basophils Absolute: 0 10*3/uL (ref 0.0–0.1)
EOS%: 6.1 % (ref 0.0–7.0)
Eosinophils Absolute: 0.6 10*3/uL — ABNORMAL HIGH (ref 0.0–0.5)
HGB: 10 g/dL — ABNORMAL LOW (ref 11.6–15.9)
LYMPH%: 35.3 % (ref 14.0–49.7)
MCH: 32 pg (ref 25.1–34.0)
MCV: 96.2 fL (ref 79.5–101.0)
MONO%: 8.4 % (ref 0.0–14.0)
NEUT#: 4.8 10*3/uL (ref 1.5–6.5)
Platelets: 237 10*3/uL (ref 145–400)

## 2011-03-22 NOTE — Telephone Encounter (Signed)
appt made and printed for 09/24/11,aom

## 2011-03-22 NOTE — Progress Notes (Signed)
Hematology and Oncology Follow Up Visit  Dominic Rhome [id number] 11-08-1940 71 y.o. 03/22/2011 10:27 AM  CC: Ralene Ok, M.D.    Principle Diagnosis:  This is a 72 year old female with the following diagnoses:  1. 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  She is currently receiving intermittent Aranesp 300 mcg to keep her hemoglobin around 11 every 4 months.  2. Monoclonal gammopathy of undetermined significance.  She has IgG lambda M-spike less than 1 gm/dL.  Bone marrow biopsy done back in June 2010 showed no evidence of plasmacytosis.    Current therapy: Aranesp 300 mcg subcu every 4 months to keep her hemoglobin around 11, as well as surveillance.  Interim History:   Ms. Larsen presents today for an office followup visit.  This is a very pleasant elderly female with longstanding hypertension and renal insufficiency.  She presents today and reports she has been doing extremely well.  She still remains on Aranesp 300 mcg subcu every 4 months to keep her hemoglobin around 11.  She has been doing quite well without any problems at this point.  She is not reporting any type of injection-related reactions or toxicity.  She is not reporting any headaches or visual changes, any mucositis, any nausea, vomiting, diarrhea, constipation, chest pain, shortness of breath, productive cough, abdominal pain or abdominal swelling, any lower extremity paresthesias, bowel or bladder incontinence.  She is not reporting any type of decline in her performance status or activity level.  She has an excellent quality of life.  She continues to perform activities of living independently without any hindrance or decline.  She is not having any fevers, chills, or night sweats, any back pain or shoulder pain.  She denies any obvious bleeding such as melena, hematochezia, or hematuria.  Medications: reviewed and unchanged.  Allergies:  Allergies  Allergen Reactions  . Penicillins     Past Medical  History, Surgical history, Social history, and Family History were reviewed and updated.  Review of Systems: Constitutional:  Negative for fever, chills, night sweats, anorexia, weight loss, pain. Cardiovascular: no chest pain or dyspnea on exertion Respiratory: no cough, shortness of breath, or wheezing Neurological: no TIA or stroke symptoms Dermatological: negative ENT: negative Skin: Negative. Gastrointestinal: no abdominal pain, change in bowel habits, or black or bloody stools Genito-Urinary: no dysuria, trouble voiding, or hematuria Hematological and Lymphatic: negative Breast: negative Musculoskeletal: negative Remaining ROS negative. Physical Exam: Blood pressure 186/86, pulse 110, temperature 98.2 F (36.8 C), temperature source Oral, height 5\' 6"  (1.676 m), weight 190 lb 11.2 oz (86.501 kg). ECOG:  General appearance: alert Head: Normocephalic, without obvious abnormality, atraumatic Neck: no adenopathy, no carotid bruit, no JVD, supple, symmetrical, trachea midline and thyroid not enlarged, symmetric, no tenderness/mass/nodules Lymph nodes: Cervical, supraclavicular, and axillary nodes normal. Heart:regular rate and rhythm, S1, S2 normal, no murmur, click, rub or gallop Lung:chest clear, no wheezing, rales, normal symmetric air entry Abdomin: soft, non-tender, without masses or organomegaly EXT:no erythema, induration, or nodules   Lab Results: Lab Results  Component Value Date   WBC 9.6 03/22/2011   HGB 10.0* 03/22/2011   HCT 30.0* 03/22/2011   MCV 96.2 03/22/2011   PLT 237 03/22/2011     Chemistry      Component Value Date/Time   NA 140 11/20/2010 0920   K 4.7 11/20/2010 0920   CL 103 11/20/2010 0920   CO2 26 11/20/2010 0920   BUN 61* 11/20/2010 0920   CREATININE 3.52* 11/20/2010 0920  Component Value Date/Time   CALCIUM 10.3 11/20/2010 0920   ALKPHOS 62 11/20/2010 0920   AST 24 11/20/2010 0920   ALT 18 11/20/2010 0920   BILITOT 0.4 11/20/2010 0920        Impression and  Plan:  This is a pleasant 71 year old female with the following issues:   1. Anemia.  Her anemia probably is due to renal disease.  She is on Aranesp 300 mcg subcu to keep her hemoglobin around 10-11 every 4 to 6 months.  Her hemoglobin is 10 today.  As such, she willnot receive an Aranesp injection.  2. Monoclonal gammopathy of undetermined significance.  Both of these studies continue to show an M-spike of 1 gm/dL.  Bone marrow biopsy 2 years ago did not show any evidence of any plasmacytosis.  We will continue with observation.  3.     Followup: in 6 months.  [facility], MD 1/4/201310:27 AM

## 2011-03-26 LAB — KAPPA/LAMBDA LIGHT CHAINS: Kappa:Lambda Ratio: 0.31 (ref 0.26–1.65)

## 2011-03-26 LAB — COMPREHENSIVE METABOLIC PANEL
ALT: 22 U/L (ref 0–35)
AST: 25 U/L (ref 0–37)
Albumin: 4.2 g/dL (ref 3.5–5.2)
Alkaline Phosphatase: 72 U/L (ref 39–117)
Calcium: 10.4 mg/dL (ref 8.4–10.5)
Chloride: 104 mEq/L (ref 96–112)
Creatinine, Ser: 2.98 mg/dL — ABNORMAL HIGH (ref 0.50–1.10)
Potassium: 4.6 mEq/L (ref 3.5–5.3)

## 2011-03-26 LAB — SPEP & IFE WITH QIG
Alpha-2-Globulin: 11.7 % (ref 7.1–11.8)
Gamma Globulin: 20.6 % — ABNORMAL HIGH (ref 11.1–18.8)
IgG (Immunoglobin G), Serum: 1740 mg/dL — ABNORMAL HIGH (ref 690–1700)
IgM, Serum: 107 mg/dL (ref 52–322)
M-Spike, %: 0.92 g/dL
Total Protein, Serum Electrophoresis: 7.7 g/dL (ref 6.0–8.3)

## 2011-04-05 ENCOUNTER — Encounter: Payer: Self-pay | Admitting: Medical

## 2011-09-24 ENCOUNTER — Ambulatory Visit (HOSPITAL_BASED_OUTPATIENT_CLINIC_OR_DEPARTMENT_OTHER): Payer: Medicare Other | Admitting: Oncology

## 2011-09-24 ENCOUNTER — Telehealth: Payer: Self-pay | Admitting: Oncology

## 2011-09-24 ENCOUNTER — Other Ambulatory Visit (HOSPITAL_BASED_OUTPATIENT_CLINIC_OR_DEPARTMENT_OTHER): Payer: Medicare Other | Admitting: Lab

## 2011-09-24 VITALS — BP 186/81 | HR 74 | Temp 97.6°F | Ht 66.0 in | Wt 195.5 lb

## 2011-09-24 DIAGNOSIS — D649 Anemia, unspecified: Secondary | ICD-10-CM

## 2011-09-24 DIAGNOSIS — D472 Monoclonal gammopathy: Secondary | ICD-10-CM

## 2011-09-24 DIAGNOSIS — I10 Essential (primary) hypertension: Secondary | ICD-10-CM

## 2011-09-24 DIAGNOSIS — N289 Disorder of kidney and ureter, unspecified: Secondary | ICD-10-CM

## 2011-09-24 LAB — CBC WITH DIFFERENTIAL/PLATELET
BASO%: 0.8 % (ref 0.0–2.0)
Basophils Absolute: 0.1 10*3/uL (ref 0.0–0.1)
EOS%: 4.7 % (ref 0.0–7.0)
Eosinophils Absolute: 0.4 10*3/uL (ref 0.0–0.5)
HCT: 30 % — ABNORMAL LOW (ref 34.8–46.6)
HGB: 10 g/dL — ABNORMAL LOW (ref 11.6–15.9)
LYMPH%: 46.8 % (ref 14.0–49.7)
MCH: 32 pg (ref 25.1–34.0)
MCHC: 33.2 g/dL (ref 31.5–36.0)
MCV: 96.5 fL (ref 79.5–101.0)
MONO#: 0.6 10*3/uL (ref 0.1–0.9)
MONO%: 7 % (ref 0.0–14.0)
NEUT#: 3.7 10*3/uL (ref 1.5–6.5)
NEUT%: 40.7 % (ref 38.4–76.8)
Platelets: 203 10*3/uL (ref 145–400)
RBC: 3.11 10*6/uL — ABNORMAL LOW (ref 3.70–5.45)
RDW: 14.4 % (ref 11.2–14.5)
WBC: 9 10*3/uL (ref 3.9–10.3)
lymph#: 4.2 10*3/uL — ABNORMAL HIGH (ref 0.9–3.3)

## 2011-09-24 LAB — COMPREHENSIVE METABOLIC PANEL
ALT: 16 U/L (ref 0–35)
Albumin: 4.1 g/dL (ref 3.5–5.2)
CO2: 26 mEq/L (ref 19–32)
Chloride: 104 mEq/L (ref 96–112)
Glucose, Bld: 102 mg/dL — ABNORMAL HIGH (ref 70–99)
Potassium: 4.4 mEq/L (ref 3.5–5.3)
Sodium: 140 mEq/L (ref 135–145)
Total Bilirubin: 0.4 mg/dL (ref 0.3–1.2)
Total Protein: 7.2 g/dL (ref 6.0–8.3)

## 2011-09-24 NOTE — Telephone Encounter (Signed)
appts made and printed for pt aom °

## 2011-09-24 NOTE — Progress Notes (Signed)
Hematology and Oncology Follow Up Visit  Paislea Hatton [id number] 1940-06-13 71 y.o. 09/24/2011 9:03 AM  CC: Ralene Ok, M.D.    Principle Diagnosis:  This is a 71 year old female with the following diagnoses:  1. 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  She was  receiving intermittent Aranesp 300 mcg to keep her hemoglobin around 11. Recently have elected against it. 2. Monoclonal gammopathy of undetermined significance.  She has IgG lambda M-spike less than 1 gm/dL.  Bone marrow biopsy done back in June 2010 showed no evidence of plasmacytosis.    Current therapy:  Active surveillance at this time.  Interim History:   Ms. Hokenson presents today for an office followup visit.  This is a very pleasant elderly female with longstanding hypertension and renal insufficiency.  She presents today and reports she has been doing extremely well.  She still remains off Aranesp 300 mcg subcu and have done well.  She has been doing quite well without any problems at this point.  She is not reporting any headaches or visual changes, any mucositis, any nausea, vomiting, diarrhea, constipation, chest pain, shortness of breath, productive cough, abdominal pain or abdominal swelling, any lower extremity paresthesias, bowel or bladder incontinence.  She is not reporting any type of decline in her performance status or activity level.  She has an excellent quality of life.  She continues to perform activities of living independently without any hindrance or decline.  She is not having any fevers, chills, or night sweats, any back pain or shoulder pain.  She denies any obvious bleeding such as melena, hematochezia, or hematuria.   Medications: reviewed and unchanged.  Allergies:  Allergies  Allergen Reactions  . Penicillins     Past Medical History, Surgical history, Social history, and Family History were reviewed and updated.  Review of Systems: Constitutional:  Negative for fever, chills,  night sweats, anorexia, weight loss, pain. Cardiovascular: no chest pain or dyspnea on exertion Respiratory: no cough, shortness of breath, or wheezing Neurological: no TIA or stroke symptoms Dermatological: negative ENT: negative Skin: Negative. Gastrointestinal: no abdominal pain, change in bowel habits, or black or bloody stools Genito-Urinary: no dysuria, trouble voiding, or hematuria Hematological and Lymphatic: negative Breast: negative Musculoskeletal: negative Remaining ROS negative. Physical Exam: Blood pressure 186/81, pulse 74, temperature 97.6 F (36.4 C), temperature source Oral, height 5\' 6"  (1.676 m), weight 195 lb 8 oz (88.678 kg). ECOG: 1 General appearance: alert Head: Normocephalic, without obvious abnormality, atraumatic Neck: no adenopathy, no carotid bruit, no JVD, supple, symmetrical, trachea midline and thyroid not enlarged, symmetric, no tenderness/mass/nodules Lymph nodes: Cervical, supraclavicular, and axillary nodes normal. Heart:regular rate and rhythm, S1, S2 normal, no murmur, click, rub or gallop Lung:chest clear, no wheezing, rales, normal symmetric air entry Abdomin: soft, non-tender, without masses or organomegaly EXT:no erythema, induration, or nodules   Lab Results: Lab Results  Component Value Date   WBC 9.0 09/24/2011   HGB 10.0* 09/24/2011   HCT 30.0* 09/24/2011   MCV 96.5 09/24/2011   PLT 203 09/24/2011             Impression and Plan:  This is a pleasant 71 year old female with the following issues:   1. Anemia.  Her anemia probably is due to renal disease.  She is off Aranesp at this time and  Her hemoglobin is 10 today which is unchanged. For the time being, we will continue to mointor her and we can restart Aransep if her Hgb drops down.  2. Monoclonal  gammopathy of undetermined significance.  Both of these studies continue to show an M-spike of 1 gm/dL.  Bone marrow biopsy 3 years ago did not show any evidence of any plasmacytosis.  We  will continue with observation.  3.     Followup: in 6 months.  [facility], MD 7/9/20139:03 AM

## 2012-03-24 ENCOUNTER — Telehealth: Payer: Self-pay | Admitting: Oncology

## 2012-03-24 ENCOUNTER — Other Ambulatory Visit (HOSPITAL_BASED_OUTPATIENT_CLINIC_OR_DEPARTMENT_OTHER): Payer: Medicare Other | Admitting: Lab

## 2012-03-24 ENCOUNTER — Ambulatory Visit (HOSPITAL_BASED_OUTPATIENT_CLINIC_OR_DEPARTMENT_OTHER): Payer: Medicare Other | Admitting: Oncology

## 2012-03-24 VITALS — BP 194/95 | HR 110 | Temp 97.3°F | Resp 20 | Ht 66.0 in | Wt 193.8 lb

## 2012-03-24 DIAGNOSIS — D649 Anemia, unspecified: Secondary | ICD-10-CM

## 2012-03-24 DIAGNOSIS — D472 Monoclonal gammopathy: Secondary | ICD-10-CM

## 2012-03-24 LAB — COMPREHENSIVE METABOLIC PANEL (CC13)
AST: 31 U/L (ref 5–34)
BUN: 36 mg/dL — ABNORMAL HIGH (ref 7.0–26.0)
Calcium: 10.8 mg/dL — ABNORMAL HIGH (ref 8.4–10.4)
Chloride: 107 mEq/L (ref 98–107)
Creatinine: 3 mg/dL (ref 0.6–1.1)
Glucose: 101 mg/dl — ABNORMAL HIGH (ref 70–99)

## 2012-03-24 LAB — CBC WITH DIFFERENTIAL/PLATELET
BASO%: 0.7 % (ref 0.0–2.0)
LYMPH%: 43.3 % (ref 14.0–49.7)
MCHC: 32.6 g/dL (ref 31.5–36.0)
MCV: 96 fL (ref 79.5–101.0)
MONO#: 0.8 10*3/uL (ref 0.1–0.9)
MONO%: 9.3 % (ref 0.0–14.0)
Platelets: 257 10*3/uL (ref 145–400)
RBC: 3.35 10*6/uL — ABNORMAL LOW (ref 3.70–5.45)
RDW: 13.8 % (ref 11.2–14.5)
WBC: 9.1 10*3/uL (ref 3.9–10.3)

## 2012-03-24 NOTE — Progress Notes (Signed)
Hematology and Oncology Follow Up Visit  Bonnie Alvarez [id number] [birth year] 72 y.o. 03/24/2012 9:28 AM  CC: Ralene Ok, M.D.    Principle Diagnosis:  This is a 72 year old female with the following diagnoses:  1. 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  She was  receiving intermittent Aranesp 300 mcg to keep her hemoglobin around 11. Recently have elected against it. 2. Monoclonal gammopathy of undetermined significance.  She has IgG lambda M-spike less than 1 gm/dL.  Bone marrow biopsy done back in June 2010 showed no evidence of plasmacytosis.    Current therapy:  Active surveillance at this time.  Interim History:   Bonnie Alvarez presents today for an office followup visit.  This is a very pleasant elderly female with longstanding hypertension and renal insufficiency.  She presents today and reports she has been doing well. She reports left knee pain but otherwise no other symptoms.   She is not reporting any headaches or visual changes, any mucositis, any nausea, vomiting, diarrhea, constipation, chest pain, shortness of breath, productive cough, abdominal pain or abdominal swelling, any lower extremity paresthesias, bowel or bladder incontinence.  She is not reporting any type of decline in her performance status or activity level.  She has an excellent quality of life.  She continues to perform activities of living independently without any hindrance or decline.  She is not having any fevers, chills, or night sweats, any back pain or shoulder pain.  She denies any obvious bleeding such as melena, hematochezia, or hematuria.   Medications: reviewed and unchanged.  Allergies:  Allergies  Allergen Reactions  . Penicillins     Past Medical History, Surgical history, Social history, and Family History were reviewed and updated.  Review of Systems: Constitutional:  Negative for fever, chills, night sweats, anorexia, weight loss, pain. Cardiovascular: no chest pain or  dyspnea on exertion Respiratory: no cough, shortness of breath, or wheezing Neurological: no TIA or stroke symptoms Dermatological: negative ENT: negative Skin: Negative. Gastrointestinal: no abdominal pain, change in bowel habits, or black or bloody stools Genito-Urinary: no dysuria, trouble voiding, or hematuria Hematological and Lymphatic: negative Breast: negative Musculoskeletal: negative Remaining ROS negative. Physical Exam: Blood pressure 194/95, pulse 110, temperature 97.3 F (36.3 C), temperature source Oral, resp. rate 20, height 5\' 6"  (1.676 m), weight 193 lb 12.8 oz (87.907 kg). ECOG: 1 General appearance: alert Head: Normocephalic, without obvious abnormality, atraumatic Neck: no adenopathy, no carotid bruit, no JVD, supple, symmetrical, trachea midline and thyroid not enlarged, symmetric, no tenderness/mass/nodules Lymph nodes: Cervical, supraclavicular, and axillary nodes normal. Heart:regular rate and rhythm, S1, S2 normal, no murmur, click, rub or gallop Lung:chest clear, no wheezing, rales, normal symmetric air entry Abdomin: soft, non-tender, without masses or organomegaly EXT:no erythema, induration, or nodules   Lab Results: Lab Results  Component Value Date   WBC 9.1 03/24/2012   HGB 10.5* 03/24/2012   HCT 32.1* 03/24/2012   MCV 96.0 03/24/2012   PLT 257 03/24/2012           Impression and Plan:  This is a pleasant 72 year old female with the following issues:   1. Anemia.  Her anemia probably is due to renal disease.  She is off Aranesp at this time and  Her hemoglobin is 10.5 today which is unchanged. For the time being, we will continue to mointor her and we can restart Aransep if her Hgb drops down.  2. Monoclonal gammopathy of undetermined significance.  Both of these studies continue to show an M-spike of  1 gm/dL.  Bone marrow biopsy 3 years ago did not show any evidence of any plasmacytosis.  We will continue with observation.  3.     Followup: in 6  months.  [facility], MD 1/7/20149:28 AM

## 2012-03-24 NOTE — Addendum Note (Signed)
Addended by: Reesa Chew on: 03/24/2012 09:53 AM   Modules accepted: Orders

## 2012-03-24 NOTE — Telephone Encounter (Signed)
appts made and printed for pt  °

## 2012-03-26 LAB — SPEP & IFE WITH QIG
Alpha-2-Globulin: 14 % — ABNORMAL HIGH (ref 7.1–11.8)
Beta Globulin: 4.7 % (ref 4.7–7.2)
Gamma Globulin: 19.2 % — ABNORMAL HIGH (ref 11.1–18.8)
IgA: 114 mg/dL (ref 69–380)
IgG (Immunoglobin G), Serum: 1680 mg/dL (ref 690–1700)
M-Spike, %: 0.79 g/dL

## 2012-08-06 ENCOUNTER — Other Ambulatory Visit: Payer: Self-pay | Admitting: Internal Medicine

## 2012-08-06 DIAGNOSIS — I10 Essential (primary) hypertension: Secondary | ICD-10-CM

## 2012-08-13 ENCOUNTER — Ambulatory Visit
Admission: RE | Admit: 2012-08-13 | Discharge: 2012-08-13 | Disposition: A | Discharge status: Home / Self Care | Payer: Medicare Other | Source: Ambulatory Visit | Attending: Internal Medicine | Admitting: Internal Medicine

## 2012-08-13 DIAGNOSIS — I10 Essential (primary) hypertension: Secondary | ICD-10-CM

## 2012-09-22 ENCOUNTER — Other Ambulatory Visit (HOSPITAL_BASED_OUTPATIENT_CLINIC_OR_DEPARTMENT_OTHER): Payer: Medicare Other | Admitting: Lab

## 2012-09-22 ENCOUNTER — Telehealth: Payer: Self-pay | Admitting: Oncology

## 2012-09-22 ENCOUNTER — Ambulatory Visit (HOSPITAL_BASED_OUTPATIENT_CLINIC_OR_DEPARTMENT_OTHER): Payer: Medicare Other | Admitting: Oncology

## 2012-09-22 VITALS — BP 171/72 | HR 84 | Temp 97.9°F | Resp 18 | Ht 66.0 in | Wt 194.4 lb

## 2012-09-22 DIAGNOSIS — N039 Chronic nephritic syndrome with unspecified morphologic changes: Secondary | ICD-10-CM

## 2012-09-22 DIAGNOSIS — D649 Anemia, unspecified: Secondary | ICD-10-CM

## 2012-09-22 DIAGNOSIS — D631 Anemia in chronic kidney disease: Secondary | ICD-10-CM

## 2012-09-22 DIAGNOSIS — D472 Monoclonal gammopathy: Secondary | ICD-10-CM

## 2012-09-22 LAB — CBC WITH DIFFERENTIAL/PLATELET
BASO%: 1.1 % (ref 0.0–2.0)
Eosinophils Absolute: 0.3 10*3/uL (ref 0.0–0.5)
LYMPH%: 42.5 % (ref 14.0–49.7)
MCHC: 33.4 g/dL (ref 31.5–36.0)
MCV: 93.6 fL (ref 79.5–101.0)
MONO%: 7.6 % (ref 0.0–14.0)
Platelets: 207 10*3/uL (ref 145–400)
RBC: 3.12 10*6/uL — ABNORMAL LOW (ref 3.70–5.45)

## 2012-09-22 LAB — COMPREHENSIVE METABOLIC PANEL (CC13)
Alkaline Phosphatase: 75 U/L (ref 40–150)
Glucose: 118 mg/dl (ref 70–140)
Sodium: 140 mEq/L (ref 136–145)
Total Bilirubin: 0.32 mg/dL (ref 0.20–1.20)
Total Protein: 8.1 g/dL (ref 6.4–8.3)

## 2012-09-22 NOTE — Progress Notes (Signed)
Hematology and Oncology Follow Up Visit  Bonnie Alvarez [id number] 1940-08-21 72 y.o. 09/22/2012 10:05 AM  CC: Ralene Ok, M.D.    Principle Diagnosis:  This is a 72 year old female with the following diagnoses:  1. 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  She was  receiving intermittent Aranesp 300 mcg to keep her hemoglobin around 11. Recently have elected against it. 2. Monoclonal gammopathy of undetermined significance.  She has IgG lambda M-spike less than 1 gm/dL.  Bone marrow biopsy done back in June 2010 showed no evidence of plasmacytosis.    Current therapy:  Active surveillance at this time.  Interim History:   [name] presents today for an office followup visit.  This is a very pleasant elderly female with longstanding hypertension and renal insufficiency.  She presents today and reports she has been doing well. She reports left knee pain but otherwise no other symptoms.   She is not reporting any headaches or visual changes, any mucositis, any nausea, vomiting, diarrhea, constipation, chest pain, shortness of breath, productive cough, abdominal pain or abdominal swelling, any lower extremity paresthesias, bowel or bladder incontinence.  She is not reporting any type of decline in her performance status or activity level.  She has an excellent quality of life.  She continues to perform activities of living independently without any hindrance or decline.  She is not having any fevers, chills, or night sweats, any back pain or shoulder pain.  She denies any obvious bleeding such as melena, hematochezia, or hematuria. She uses a cane for mobility and she is accompanied by her son today.   Medications: reviewed and unchanged.  Allergies:  Allergies  Allergen Reactions  . Penicillins     Past Medical History, Surgical history, Social history, and Family History were reviewed and updated.  Review of Systems: Constitutional:  Negative for fever, chills, night  sweats, anorexia, weight loss, pain. Cardiovascular: no chest pain or dyspnea on exertion Respiratory: no cough, shortness of breath, or wheezing Neurological: no TIA or stroke symptoms Dermatological: negative ENT: negative Skin: Negative. Gastrointestinal: no abdominal pain, change in bowel habits, or black or bloody stools Genito-Urinary: no dysuria, trouble voiding, or hematuria Hematological and Lymphatic: negative Breast: negative Musculoskeletal: negative Remaining ROS negative. Physical Exam: Blood pressure 171/72, pulse 84, temperature 97.9 F (36.6 C), temperature source Oral, resp. rate 18, height 5\' 6"  (1.676 m), weight 194 lb 6.4 oz (88.179 kg). ECOG: 1 General appearance: alert Head: Normocephalic, without obvious abnormality, atraumatic Neck: no adenopathy, no carotid bruit, no JVD, supple, symmetrical, trachea midline and thyroid not enlarged, symmetric, no tenderness/mass/nodules Lymph nodes: Cervical, supraclavicular, and axillary nodes normal. Heart:regular rate and rhythm, S1, S2 normal, no murmur, click, rub or gallop Lung:chest clear, no wheezing, rales, normal symmetric air entry Abdomin: soft, non-tender, without masses or organomegaly EXT:no erythema, induration, or nodules   Lab Results: Lab Results  Component Value Date   WBC 9.8 09/22/2012   HGB 9.7* 09/22/2012   HCT 29.2* 09/22/2012   MCV 93.6 09/22/2012   PLT 207 09/22/2012           Impression and Plan:  This is a pleasant 72 year old female with the following issues:   1. Anemia.  Her anemia probably is due to renal disease.  She is off Aranesp at this time and  Her hemoglobin is 9.7 today which is unchanged. For the time being, we will continue to mointor her and we can restart Aransep if her Hgb drops down.  2. Monoclonal gammopathy  of undetermined significance.  Both of these studies continue to show an M-spike of 1 gm/dL.  Bone marrow biopsy 4 years ago did not show any evidence of any  plasmacytosis.  We will continue with observation.  3.     Followup: in 6 months.  [facility], MD 7/8/201410:05 AM

## 2012-09-22 NOTE — Telephone Encounter (Signed)
Gave pt appt for lab and MD on January 2015 °

## 2012-09-24 LAB — SPEP & IFE WITH QIG
Albumin ELP: 53.7 % — ABNORMAL LOW (ref 55.8–66.1)
Beta Globulin: 4.7 % (ref 4.7–7.2)
IgA: 111 mg/dL (ref 69–380)
Total Protein, Serum Electrophoresis: 7.6 g/dL (ref 6.0–8.3)

## 2012-12-22 ENCOUNTER — Other Ambulatory Visit (HOSPITAL_COMMUNITY): Payer: Self-pay | Admitting: Internal Medicine

## 2012-12-22 DIAGNOSIS — Z1231 Encounter for screening mammogram for malignant neoplasm of breast: Secondary | ICD-10-CM

## 2013-01-01 ENCOUNTER — Ambulatory Visit (HOSPITAL_COMMUNITY)
Admission: RE | Admit: 2013-01-01 | Discharge: 2013-01-01 | Disposition: A | Discharge status: Home / Self Care | Payer: Medicare Other | Source: Ambulatory Visit | Attending: Internal Medicine | Admitting: Internal Medicine

## 2013-01-01 DIAGNOSIS — Z1231 Encounter for screening mammogram for malignant neoplasm of breast: Secondary | ICD-10-CM | POA: Insufficient documentation

## 2013-02-22 ENCOUNTER — Telehealth: Payer: Self-pay | Admitting: *Deleted

## 2013-02-22 NOTE — Telephone Encounter (Signed)
Pt called to cancel her appts for 03/25/2013....td

## 2013-03-25 ENCOUNTER — Ambulatory Visit: Payer: Medicare Other | Admitting: Oncology

## 2013-03-25 ENCOUNTER — Other Ambulatory Visit: Payer: Medicare Other | Admitting: Lab

## 2014-01-19 ENCOUNTER — Other Ambulatory Visit (HOSPITAL_COMMUNITY): Payer: Self-pay | Admitting: Internal Medicine

## 2014-01-19 DIAGNOSIS — Z1231 Encounter for screening mammogram for malignant neoplasm of breast: Secondary | ICD-10-CM

## 2014-02-03 ENCOUNTER — Ambulatory Visit (HOSPITAL_COMMUNITY)
Admission: RE | Admit: 2014-02-03 | Discharge: 2014-02-03 | Disposition: A | Discharge status: Home / Self Care | Payer: Medicare Other | Source: Ambulatory Visit | Attending: Internal Medicine | Admitting: Internal Medicine

## 2014-02-03 DIAGNOSIS — Z1231 Encounter for screening mammogram for malignant neoplasm of breast: Secondary | ICD-10-CM | POA: Diagnosis not present

## 2015-02-16 ENCOUNTER — Other Ambulatory Visit: Payer: Self-pay | Admitting: Nephrology

## 2015-02-16 DIAGNOSIS — N184 Chronic kidney disease, stage 4 (severe): Secondary | ICD-10-CM

## 2015-02-17 ENCOUNTER — Encounter: Payer: Self-pay | Admitting: Vascular Surgery

## 2015-02-17 ENCOUNTER — Other Ambulatory Visit: Payer: Self-pay | Admitting: *Deleted

## 2015-02-17 DIAGNOSIS — N185 Chronic kidney disease, stage 5: Secondary | ICD-10-CM

## 2015-02-17 DIAGNOSIS — Z01810 Encounter for preprocedural cardiovascular examination: Secondary | ICD-10-CM

## 2015-02-20 ENCOUNTER — Ambulatory Visit
Admission: RE | Admit: 2015-02-20 | Discharge: 2015-02-20 | Disposition: A | Discharge status: Home / Self Care | Payer: Medicare Other | Source: Ambulatory Visit | Attending: Nephrology | Admitting: Nephrology

## 2015-02-20 ENCOUNTER — Other Ambulatory Visit: Payer: Self-pay

## 2015-02-20 DIAGNOSIS — N184 Chronic kidney disease, stage 4 (severe): Secondary | ICD-10-CM

## 2015-02-22 NOTE — Discharge Instructions (Signed)
Darbepoetin Alfa injection What is this medicine? DARBEPOETIN ALFA (dar be POE e tin AL fa) helps your body make more red blood cells. It is used to treat anemia caused by chronic kidney failure and chemotherapy. This medicine may be used for other purposes; ask your health care provider or pharmacist if you have questions. What should I tell my health care provider before I take this medicine? They need to know if you have any of these conditions: -blood clotting disorders or history of blood clots -cancer patient not on chemotherapy -cystic fibrosis -heart disease, such as angina, heart failure, or a history of a heart attack -hemoglobin level of 12 g/dL or greater -high blood pressure -low levels of folate, iron, or vitamin B12 -seizures -an unusual or allergic reaction to darbepoetin, erythropoietin, albumin, hamster proteins, latex, other medicines, foods, dyes, or preservatives -pregnant or trying to get pregnant -breast-feeding How should I use this medicine? This medicine is for injection into a vein or under the skin. It is usually given by a health care professional in a hospital or clinic setting. If you get this medicine at home, you will be taught how to prepare and give this medicine. Do not shake the solution before you withdraw a dose. Use exactly as directed. Take your medicine at regular intervals. Do not take your medicine more often than directed. It is important that you put your used needles and syringes in a special sharps container. Do not put them in a trash can. If you do not have a sharps container, call your pharmacist or healthcare provider to get one. Talk to your pediatrician regarding the use of this medicine in children. While this medicine may be used in children as young as 1 year for selected conditions, precautions do apply. Overdosage: If you think you have taken too much of this medicine contact a poison control center or emergency room at once. NOTE:  This medicine is only for you. Do not share this medicine with others. What if I miss a dose? If you miss a dose, take it as soon as you can. If it is almost time for your next dose, take only that dose. Do not take double or extra doses. What may interact with this medicine? Do not take this medicine with any of the following medications: -epoetin alfa This list may not describe all possible interactions. Give your health care provider a list of all the medicines, herbs, non-prescription drugs, or dietary supplements you use. Also tell them if you smoke, drink alcohol, or use illegal drugs. Some items may interact with your medicine. What should I watch for while using this medicine? Visit your prescriber or health care professional for regular checks on your progress and for the needed blood tests and blood pressure measurements. It is especially important for the doctor to make sure your hemoglobin level is in the desired range, to limit the risk of potential side effects and to give you the best benefit. Keep all appointments for any recommended tests. Check your blood pressure as directed. Ask your doctor what your blood pressure should be and when you should contact him or her. As your body makes more red blood cells, you may need to take iron, folic acid, or vitamin B supplements. Ask your doctor or health care provider which products are right for you. If you have kidney disease continue dietary restrictions, even though this medication can make you feel better. Talk with your doctor or health care professional about the   foods you eat and the vitamins that you take. What side effects may I notice from receiving this medicine? Side effects that you should report to your doctor or health care professional as soon as possible: -allergic reactions like skin rash, itching or hives, swelling of the face, lips, or tongue -breathing problems -changes in vision -chest pain -confusion, trouble speaking  or understanding -feeling faint or lightheaded, falls -high blood pressure -muscle aches or pains -pain, swelling, warmth in the leg -rapid weight gain -severe headaches -sudden numbness or weakness of the face, arm or leg -trouble walking, dizziness, loss of balance or coordination -seizures (convulsions) -swelling of the ankles, feet, hands -unusually weak or tired Side effects that usually do not require medical attention (report to your doctor or health care professional if they continue or are bothersome): -diarrhea -fever, chills (flu-like symptoms) -headaches -nausea, vomiting -redness, stinging, or swelling at site where injected This list may not describe all possible side effects. Call your doctor for medical advice about side effects. You may report side effects to FDA at 1-800-FDA-1088. Where should I keep my medicine? Keep out of the reach of children. Store in a refrigerator between 2 and 8 degrees C (36 and 46 degrees F). Do not freeze. Do not shake. Throw away any unused portion if using a single-dose vial. Throw away any unused medicine after the expiration date. NOTE: This sheet is a summary. It may not cover all possible information. If you have questions about this medicine, talk to your doctor, pharmacist, or health care provider.    2016, Elsevier/Gold Standard. (2008-02-16 10:23:57)  

## 2015-02-23 ENCOUNTER — Ambulatory Visit (HOSPITAL_COMMUNITY)
Admission: RE | Admit: 2015-02-23 | Discharge: 2015-02-23 | Disposition: A | Discharge status: Home / Self Care | Payer: Medicare Other | Source: Ambulatory Visit | Attending: Nephrology | Admitting: Nephrology

## 2015-02-23 ENCOUNTER — Encounter (HOSPITAL_COMMUNITY): Payer: Self-pay

## 2015-02-23 DIAGNOSIS — Z79899 Other long term (current) drug therapy: Secondary | ICD-10-CM | POA: Diagnosis not present

## 2015-02-23 DIAGNOSIS — D631 Anemia in chronic kidney disease: Secondary | ICD-10-CM | POA: Diagnosis not present

## 2015-02-23 DIAGNOSIS — Z5181 Encounter for therapeutic drug level monitoring: Secondary | ICD-10-CM | POA: Insufficient documentation

## 2015-02-23 DIAGNOSIS — N184 Chronic kidney disease, stage 4 (severe): Secondary | ICD-10-CM | POA: Insufficient documentation

## 2015-02-23 LAB — POCT HEMOGLOBIN-HEMACUE: HEMOGLOBIN: 8.5 g/dL — AB (ref 12.0–15.0)

## 2015-02-23 MED ORDER — DARBEPOETIN ALFA 200 MCG/0.4ML IJ SOSY
200.0000 ug | PREFILLED_SYRINGE | INTRAMUSCULAR | Status: DC
  Administered 2015-02-23: 200 ug via SUBCUTANEOUS

## 2015-02-23 MED ORDER — DARBEPOETIN ALFA 200 MCG/0.4ML IJ SOSY
PREFILLED_SYRINGE | INTRAMUSCULAR | Status: AC
  Filled 2015-02-23: qty 0.4

## 2015-03-23 ENCOUNTER — Encounter (HOSPITAL_COMMUNITY)
Admission: RE | Admit: 2015-03-23 | Discharge: 2015-03-23 | Disposition: A | Discharge status: Home / Self Care | Payer: Medicare Other | Source: Ambulatory Visit | Attending: Nephrology | Admitting: Nephrology

## 2015-03-23 DIAGNOSIS — D631 Anemia in chronic kidney disease: Secondary | ICD-10-CM | POA: Diagnosis not present

## 2015-03-23 DIAGNOSIS — Z5181 Encounter for therapeutic drug level monitoring: Secondary | ICD-10-CM | POA: Insufficient documentation

## 2015-03-23 DIAGNOSIS — Z79899 Other long term (current) drug therapy: Secondary | ICD-10-CM | POA: Diagnosis not present

## 2015-03-23 DIAGNOSIS — N185 Chronic kidney disease, stage 5: Secondary | ICD-10-CM | POA: Diagnosis not present

## 2015-03-23 LAB — RENAL FUNCTION PANEL
ALBUMIN: 3.6 g/dL (ref 3.5–5.0)
ANION GAP: 12 (ref 5–15)
BUN: 61 mg/dL — ABNORMAL HIGH (ref 6–20)
CHLORIDE: 106 mmol/L (ref 101–111)
CO2: 22 mmol/L (ref 22–32)
Calcium: 10.2 mg/dL (ref 8.9–10.3)
Creatinine, Ser: 3.84 mg/dL — ABNORMAL HIGH (ref 0.44–1.00)
GFR calc Af Amer: 12 mL/min — ABNORMAL LOW (ref >60)
GFR calc non Af Amer: 10 mL/min — ABNORMAL LOW (ref >60)
GLUCOSE: 93 mg/dL (ref 65–99)
PHOSPHORUS: 3.7 mg/dL (ref 2.5–4.6)
POTASSIUM: 4.1 mmol/L (ref 3.5–5.1)
Sodium: 140 mmol/L (ref 135–145)

## 2015-03-23 LAB — IRON AND TIBC
Iron: 55 ug/dL (ref 28–170)
Saturation Ratios: 19 % (ref 10.4–31.8)
TIBC: 287 ug/dL (ref 250–450)
UIBC: 232 ug/dL

## 2015-03-23 LAB — POCT HEMOGLOBIN-HEMACUE: HEMOGLOBIN: 9.7 g/dL — AB (ref 12.0–15.0)

## 2015-03-23 MED ORDER — DARBEPOETIN ALFA 200 MCG/0.4ML IJ SOSY: 200.0000 ug | PREFILLED_SYRINGE | INTRAMUSCULAR | Status: DC

## 2015-03-23 MED ORDER — DARBEPOETIN ALFA 200 MCG/0.4ML IJ SOSY
PREFILLED_SYRINGE | INTRAMUSCULAR | Status: AC
  Administered 2015-03-23: 200 ug via SUBCUTANEOUS
  Filled 2015-03-23: qty 0.4

## 2015-03-30 ENCOUNTER — Other Ambulatory Visit (HOSPITAL_COMMUNITY): Payer: Self-pay

## 2015-03-30 ENCOUNTER — Encounter (HOSPITAL_COMMUNITY): Payer: Self-pay

## 2015-03-30 ENCOUNTER — Ambulatory Visit: Payer: Self-pay | Admitting: Vascular Surgery

## 2015-04-20 ENCOUNTER — Encounter (HOSPITAL_COMMUNITY)
Admission: RE | Admit: 2015-04-20 | Discharge: 2015-04-20 | Disposition: A | Discharge status: Home / Self Care | Payer: Medicare Other | Source: Ambulatory Visit | Attending: Nephrology | Admitting: Nephrology

## 2015-04-20 DIAGNOSIS — D631 Anemia in chronic kidney disease: Secondary | ICD-10-CM | POA: Diagnosis not present

## 2015-04-20 DIAGNOSIS — N185 Chronic kidney disease, stage 5: Secondary | ICD-10-CM | POA: Insufficient documentation

## 2015-04-20 DIAGNOSIS — Z5181 Encounter for therapeutic drug level monitoring: Secondary | ICD-10-CM | POA: Insufficient documentation

## 2015-04-20 DIAGNOSIS — Z79899 Other long term (current) drug therapy: Secondary | ICD-10-CM | POA: Diagnosis not present

## 2015-04-20 LAB — RENAL FUNCTION PANEL
ANION GAP: 11 (ref 5–15)
Albumin: 3.9 g/dL (ref 3.5–5.0)
BUN: 51 mg/dL — ABNORMAL HIGH (ref 6–20)
CHLORIDE: 103 mmol/L (ref 101–111)
CO2: 25 mmol/L (ref 22–32)
Calcium: 10.5 mg/dL — ABNORMAL HIGH (ref 8.9–10.3)
Creatinine, Ser: 3.52 mg/dL — ABNORMAL HIGH (ref 0.44–1.00)
GFR, EST AFRICAN AMERICAN: 13 mL/min — AB (ref >60)
GFR, EST NON AFRICAN AMERICAN: 12 mL/min — AB (ref >60)
Glucose, Bld: 106 mg/dL — ABNORMAL HIGH (ref 65–99)
PHOSPHORUS: 3.2 mg/dL (ref 2.5–4.6)
POTASSIUM: 3.8 mmol/L (ref 3.5–5.1)
Sodium: 139 mmol/L (ref 135–145)

## 2015-04-20 LAB — IRON AND TIBC
Iron: 47 ug/dL (ref 28–170)
SATURATION RATIOS: 17 % (ref 10.4–31.8)
TIBC: 270 ug/dL (ref 250–450)
UIBC: 223 ug/dL

## 2015-04-20 LAB — POCT HEMOGLOBIN-HEMACUE: HEMOGLOBIN: 10.6 g/dL — AB (ref 12.0–15.0)

## 2015-04-20 MED ORDER — DARBEPOETIN ALFA 200 MCG/0.4ML IJ SOSY
PREFILLED_SYRINGE | INTRAMUSCULAR | Status: AC
  Filled 2015-04-20: qty 0.4

## 2015-04-20 NOTE — Progress Notes (Signed)
Pts BP was to high today to get her shot  It was 219/99  Pt is going to take her BP meds BEFORE she arrives and will come in Monday and we will retry

## 2015-04-24 ENCOUNTER — Encounter (HOSPITAL_COMMUNITY)
Admission: RE | Admit: 2015-04-24 | Discharge: 2015-04-24 | Disposition: A | Discharge status: Home / Self Care | Payer: Medicare Other | Source: Ambulatory Visit | Attending: Nephrology | Admitting: Nephrology

## 2015-04-24 DIAGNOSIS — I160 Hypertensive urgency: Secondary | ICD-10-CM | POA: Diagnosis present

## 2015-04-24 MED ORDER — CLONIDINE HCL 0.1 MG PO TABS
0.2000 mg | ORAL_TABLET | Freq: Once | ORAL | Status: AC
  Administered 2015-04-24: 0.2 mg via ORAL

## 2015-04-24 MED ORDER — CLONIDINE HCL 0.1 MG PO TABS
ORAL_TABLET | ORAL | Status: AC
  Filled 2015-04-24: qty 2

## 2015-04-24 MED ORDER — DARBEPOETIN ALFA 200 MCG/0.4ML IJ SOSY: 200.0000 ug | PREFILLED_SYRINGE | INTRAMUSCULAR | Status: DC

## 2015-04-24 NOTE — Progress Notes (Signed)
BP elevated beyond parameters for allowing injection to be given.  Called and spoke with Dr Joelyn Oms and reported pts VS (see flowsheet), that pt was rescheduled for today from last Friday for the same reason and injection was held, and that pt stated she had taken all her BP meds today, however son stated she was only taking hydralazine once a day instead of 3 times a day as prescribed.  Orders received to give 0.2mg  of clonidine once now, have family monirot BP at home, tell pt to take BP meds as prescribed, and return to short stay in 2 days.  Pt and family verbalized understanding.

## 2015-04-26 ENCOUNTER — Encounter (HOSPITAL_COMMUNITY)
Admission: RE | Admit: 2015-04-26 | Discharge: 2015-04-26 | Disposition: A | Discharge status: Home / Self Care | Payer: Medicare Other | Source: Ambulatory Visit | Attending: Nephrology | Admitting: Nephrology

## 2015-04-26 DIAGNOSIS — N185 Chronic kidney disease, stage 5: Secondary | ICD-10-CM | POA: Diagnosis not present

## 2015-04-26 LAB — POCT HEMOGLOBIN-HEMACUE: Hemoglobin: 10.2 g/dL — ABNORMAL LOW (ref 12.0–15.0)

## 2015-04-26 MED ORDER — DARBEPOETIN ALFA 200 MCG/0.4ML IJ SOSY
200.0000 ug | PREFILLED_SYRINGE | INTRAMUSCULAR | Status: DC
  Administered 2015-04-26: 200 ug via SUBCUTANEOUS

## 2015-04-26 MED ORDER — DARBEPOETIN ALFA 200 MCG/0.4ML IJ SOSY
PREFILLED_SYRINGE | INTRAMUSCULAR | Status: AC
  Filled 2015-04-26: qty 0.4

## 2015-05-08 ENCOUNTER — Other Ambulatory Visit: Payer: Self-pay

## 2015-05-08 DIAGNOSIS — Z1231 Encounter for screening mammogram for malignant neoplasm of breast: Secondary | ICD-10-CM

## 2015-05-24 ENCOUNTER — Ambulatory Visit
Admission: RE | Admit: 2015-05-24 | Discharge: 2015-05-24 | Disposition: A | Discharge status: Home / Self Care | Payer: Medicare Other | Source: Ambulatory Visit

## 2015-05-24 ENCOUNTER — Encounter (HOSPITAL_COMMUNITY)
Admission: RE | Admit: 2015-05-24 | Discharge: 2015-05-24 | Disposition: A | Discharge status: Home / Self Care | Payer: Medicare Other | Source: Ambulatory Visit | Attending: Nephrology | Admitting: Nephrology

## 2015-05-24 DIAGNOSIS — D631 Anemia in chronic kidney disease: Secondary | ICD-10-CM | POA: Insufficient documentation

## 2015-05-24 DIAGNOSIS — N185 Chronic kidney disease, stage 5: Secondary | ICD-10-CM | POA: Insufficient documentation

## 2015-05-24 DIAGNOSIS — Z1231 Encounter for screening mammogram for malignant neoplasm of breast: Secondary | ICD-10-CM

## 2015-05-24 DIAGNOSIS — Z79899 Other long term (current) drug therapy: Secondary | ICD-10-CM | POA: Insufficient documentation

## 2015-05-24 DIAGNOSIS — Z5181 Encounter for therapeutic drug level monitoring: Secondary | ICD-10-CM | POA: Insufficient documentation

## 2015-05-24 LAB — POCT HEMOGLOBIN-HEMACUE: HEMOGLOBIN: 10.4 g/dL — AB (ref 12.0–15.0)

## 2015-05-24 LAB — RENAL FUNCTION PANEL
Albumin: 3.6 g/dL (ref 3.5–5.0)
Anion gap: 12 (ref 5–15)
BUN: 62 mg/dL — AB (ref 6–20)
CHLORIDE: 102 mmol/L (ref 101–111)
CO2: 25 mmol/L (ref 22–32)
Calcium: 10.1 mg/dL (ref 8.9–10.3)
Creatinine, Ser: 4.23 mg/dL — ABNORMAL HIGH (ref 0.44–1.00)
GFR calc Af Amer: 11 mL/min — ABNORMAL LOW (ref >60)
GFR, EST NON AFRICAN AMERICAN: 9 mL/min — AB (ref >60)
Glucose, Bld: 114 mg/dL — ABNORMAL HIGH (ref 65–99)
POTASSIUM: 3.6 mmol/L (ref 3.5–5.1)
Phosphorus: 2.9 mg/dL (ref 2.5–4.6)
Sodium: 139 mmol/L (ref 135–145)

## 2015-05-24 LAB — IRON AND TIBC
IRON: 57 ug/dL (ref 28–170)
SATURATION RATIOS: 20 % (ref 10.4–31.8)
TIBC: 280 ug/dL (ref 250–450)
UIBC: 223 ug/dL

## 2015-05-24 LAB — FERRITIN: FERRITIN: 397 ng/mL — AB (ref 11–307)

## 2015-05-24 MED ORDER — DARBEPOETIN ALFA 200 MCG/0.4ML IJ SOSY
200.0000 ug | PREFILLED_SYRINGE | INTRAMUSCULAR | Status: DC
  Administered 2015-05-24: 200 ug via SUBCUTANEOUS

## 2015-05-24 MED ORDER — DARBEPOETIN ALFA 200 MCG/0.4ML IJ SOSY
PREFILLED_SYRINGE | INTRAMUSCULAR | Status: AC
  Administered 2015-05-24: 200 ug via SUBCUTANEOUS
  Filled 2015-05-24: qty 0.4

## 2015-05-25 LAB — PTH, INTACT AND CALCIUM
CALCIUM TOTAL (PTH): 10 mg/dL (ref 8.7–10.3)
PTH: 154 pg/mL — AB (ref 15–65)

## 2015-06-21 ENCOUNTER — Encounter (HOSPITAL_COMMUNITY)
Admission: RE | Admit: 2015-06-21 | Discharge: 2015-06-21 | Disposition: A | Discharge status: Home / Self Care | Payer: Medicare Other | Source: Ambulatory Visit | Attending: Nephrology | Admitting: Nephrology

## 2015-06-21 DIAGNOSIS — Z79899 Other long term (current) drug therapy: Secondary | ICD-10-CM | POA: Diagnosis not present

## 2015-06-21 DIAGNOSIS — D631 Anemia in chronic kidney disease: Secondary | ICD-10-CM | POA: Diagnosis not present

## 2015-06-21 DIAGNOSIS — N185 Chronic kidney disease, stage 5: Secondary | ICD-10-CM | POA: Insufficient documentation

## 2015-06-21 DIAGNOSIS — Z5181 Encounter for therapeutic drug level monitoring: Secondary | ICD-10-CM | POA: Diagnosis not present

## 2015-06-21 LAB — RENAL FUNCTION PANEL
ALBUMIN: 3.7 g/dL (ref 3.5–5.0)
Anion gap: 13 (ref 5–15)
BUN: 67 mg/dL — ABNORMAL HIGH (ref 6–20)
CALCIUM: 10.1 mg/dL (ref 8.9–10.3)
CO2: 24 mmol/L (ref 22–32)
CREATININE: 4.88 mg/dL — AB (ref 0.44–1.00)
Chloride: 103 mmol/L (ref 101–111)
GFR, EST AFRICAN AMERICAN: 9 mL/min — AB (ref >60)
GFR, EST NON AFRICAN AMERICAN: 8 mL/min — AB (ref >60)
Glucose, Bld: 126 mg/dL — ABNORMAL HIGH (ref 65–99)
PHOSPHORUS: 4 mg/dL (ref 2.5–4.6)
Potassium: 4 mmol/L (ref 3.5–5.1)
SODIUM: 140 mmol/L (ref 135–145)

## 2015-06-21 LAB — POCT HEMOGLOBIN-HEMACUE: Hemoglobin: 11.1 g/dL — ABNORMAL LOW (ref 12.0–15.0)

## 2015-06-21 LAB — IRON AND TIBC
Iron: 73 ug/dL (ref 28–170)
Saturation Ratios: 26 % (ref 10.4–31.8)
TIBC: 280 ug/dL (ref 250–450)
UIBC: 207 ug/dL

## 2015-06-21 MED ORDER — DARBEPOETIN ALFA 200 MCG/0.4ML IJ SOSY
PREFILLED_SYRINGE | INTRAMUSCULAR | Status: AC
  Administered 2015-06-21: 200 ug via SUBCUTANEOUS
  Filled 2015-06-21: qty 0.4

## 2015-06-21 MED ORDER — DARBEPOETIN ALFA 200 MCG/0.4ML IJ SOSY
200.0000 ug | PREFILLED_SYRINGE | INTRAMUSCULAR | Status: DC
  Administered 2015-06-21: 200 ug via SUBCUTANEOUS

## 2015-07-19 ENCOUNTER — Encounter (HOSPITAL_COMMUNITY)
Admission: RE | Admit: 2015-07-19 | Discharge: 2015-07-19 | Disposition: A | Discharge status: Home / Self Care | Payer: Medicare Other | Source: Ambulatory Visit | Attending: Nephrology | Admitting: Nephrology

## 2015-07-19 DIAGNOSIS — Z79899 Other long term (current) drug therapy: Secondary | ICD-10-CM | POA: Insufficient documentation

## 2015-07-19 DIAGNOSIS — Z5181 Encounter for therapeutic drug level monitoring: Secondary | ICD-10-CM | POA: Diagnosis not present

## 2015-07-19 DIAGNOSIS — D631 Anemia in chronic kidney disease: Secondary | ICD-10-CM | POA: Insufficient documentation

## 2015-07-19 DIAGNOSIS — N185 Chronic kidney disease, stage 5: Secondary | ICD-10-CM | POA: Insufficient documentation

## 2015-07-19 LAB — RENAL FUNCTION PANEL
ALBUMIN: 3.7 g/dL (ref 3.5–5.0)
ANION GAP: 12 (ref 5–15)
BUN: 79 mg/dL — AB (ref 6–20)
CALCIUM: 10 mg/dL (ref 8.9–10.3)
CO2: 23 mmol/L (ref 22–32)
Chloride: 105 mmol/L (ref 101–111)
Creatinine, Ser: 4.83 mg/dL — ABNORMAL HIGH (ref 0.44–1.00)
GFR calc Af Amer: 9 mL/min — ABNORMAL LOW (ref >60)
GFR calc non Af Amer: 8 mL/min — ABNORMAL LOW (ref >60)
GLUCOSE: 111 mg/dL — AB (ref 65–99)
PHOSPHORUS: 3.6 mg/dL (ref 2.5–4.6)
Potassium: 3.8 mmol/L (ref 3.5–5.1)
SODIUM: 140 mmol/L (ref 135–145)

## 2015-07-19 LAB — IRON AND TIBC
IRON: 79 ug/dL (ref 28–170)
SATURATION RATIOS: 30 % (ref 10.4–31.8)
TIBC: 262 ug/dL (ref 250–450)
UIBC: 183 ug/dL

## 2015-07-19 LAB — POCT HEMOGLOBIN-HEMACUE: Hemoglobin: 10.6 g/dL — ABNORMAL LOW (ref 12.0–15.0)

## 2015-07-19 MED ORDER — DARBEPOETIN ALFA 200 MCG/0.4ML IJ SOSY
200.0000 ug | PREFILLED_SYRINGE | INTRAMUSCULAR | Status: DC
  Administered 2015-07-19: 200 ug via SUBCUTANEOUS

## 2015-07-19 MED ORDER — DARBEPOETIN ALFA 200 MCG/0.4ML IJ SOSY
PREFILLED_SYRINGE | INTRAMUSCULAR | Status: AC
  Filled 2015-07-19: qty 0.4

## 2015-08-16 ENCOUNTER — Encounter (HOSPITAL_COMMUNITY): Payer: Medicare Other

## 2015-08-25 ENCOUNTER — Encounter (HOSPITAL_COMMUNITY)
Admission: RE | Admit: 2015-08-25 | Discharge: 2015-08-25 | Disposition: A | Discharge status: Home / Self Care | Payer: Medicare Other | Source: Ambulatory Visit | Attending: Nephrology | Admitting: Nephrology

## 2015-08-25 DIAGNOSIS — N185 Chronic kidney disease, stage 5: Secondary | ICD-10-CM | POA: Diagnosis not present

## 2015-08-25 DIAGNOSIS — Z79899 Other long term (current) drug therapy: Secondary | ICD-10-CM | POA: Diagnosis not present

## 2015-08-25 DIAGNOSIS — Z5181 Encounter for therapeutic drug level monitoring: Secondary | ICD-10-CM | POA: Diagnosis not present

## 2015-08-25 DIAGNOSIS — D631 Anemia in chronic kidney disease: Secondary | ICD-10-CM | POA: Insufficient documentation

## 2015-08-25 LAB — IRON AND TIBC
Iron: 44 ug/dL (ref 28–170)
Saturation Ratios: 16 % (ref 10.4–31.8)
TIBC: 280 ug/dL (ref 250–450)
UIBC: 236 ug/dL

## 2015-08-25 LAB — RENAL FUNCTION PANEL
ALBUMIN: 3.6 g/dL (ref 3.5–5.0)
ANION GAP: 12 (ref 5–15)
BUN: 68 mg/dL — ABNORMAL HIGH (ref 6–20)
CALCIUM: 10.1 mg/dL (ref 8.9–10.3)
CO2: 23 mmol/L (ref 22–32)
Chloride: 103 mmol/L (ref 101–111)
Creatinine, Ser: 5.09 mg/dL — ABNORMAL HIGH (ref 0.44–1.00)
GFR, EST AFRICAN AMERICAN: 9 mL/min — AB (ref >60)
GFR, EST NON AFRICAN AMERICAN: 7 mL/min — AB (ref >60)
GLUCOSE: 107 mg/dL — AB (ref 65–99)
Phosphorus: 3.6 mg/dL (ref 2.5–4.6)
Potassium: 3.7 mmol/L (ref 3.5–5.1)
SODIUM: 138 mmol/L (ref 135–145)

## 2015-08-25 LAB — FERRITIN: Ferritin: 488 ng/mL — ABNORMAL HIGH (ref 11–307)

## 2015-08-25 LAB — POCT HEMOGLOBIN-HEMACUE: HEMOGLOBIN: 9.8 g/dL — AB (ref 12.0–15.0)

## 2015-08-25 MED ORDER — DARBEPOETIN ALFA 200 MCG/0.4ML IJ SOSY
200.0000 ug | PREFILLED_SYRINGE | INTRAMUSCULAR | Status: DC
  Administered 2015-08-25: 200 ug via SUBCUTANEOUS

## 2015-08-25 MED ORDER — DARBEPOETIN ALFA 200 MCG/0.4ML IJ SOSY
PREFILLED_SYRINGE | INTRAMUSCULAR | Status: AC
  Administered 2015-08-25: 200 ug via SUBCUTANEOUS
  Filled 2015-08-25: qty 0.4

## 2015-08-26 LAB — PTH, INTACT AND CALCIUM
CALCIUM TOTAL (PTH): 9.7 mg/dL (ref 8.7–10.3)
PTH: 237 pg/mL — AB (ref 15–65)

## 2015-09-22 ENCOUNTER — Ambulatory Visit (HOSPITAL_COMMUNITY)
Admission: RE | Admit: 2015-09-22 | Discharge: 2015-09-22 | Disposition: A | Discharge status: Home / Self Care | Payer: Medicare Other | Source: Ambulatory Visit | Attending: Nephrology | Admitting: Nephrology

## 2015-09-22 DIAGNOSIS — N185 Chronic kidney disease, stage 5: Secondary | ICD-10-CM | POA: Insufficient documentation

## 2015-09-22 DIAGNOSIS — D631 Anemia in chronic kidney disease: Secondary | ICD-10-CM | POA: Insufficient documentation

## 2015-09-22 DIAGNOSIS — Z79899 Other long term (current) drug therapy: Secondary | ICD-10-CM | POA: Diagnosis not present

## 2015-09-22 DIAGNOSIS — Z5181 Encounter for therapeutic drug level monitoring: Secondary | ICD-10-CM | POA: Diagnosis not present

## 2015-09-22 LAB — RENAL FUNCTION PANEL
Albumin: 3.5 g/dL (ref 3.5–5.0)
Anion gap: 9 (ref 5–15)
BUN: 69 mg/dL — AB (ref 6–20)
CHLORIDE: 107 mmol/L (ref 101–111)
CO2: 23 mmol/L (ref 22–32)
Calcium: 10.5 mg/dL — ABNORMAL HIGH (ref 8.9–10.3)
Creatinine, Ser: 4.23 mg/dL — ABNORMAL HIGH (ref 0.44–1.00)
GFR calc Af Amer: 11 mL/min — ABNORMAL LOW (ref >60)
GFR, EST NON AFRICAN AMERICAN: 9 mL/min — AB (ref >60)
GLUCOSE: 100 mg/dL — AB (ref 65–99)
POTASSIUM: 4.1 mmol/L (ref 3.5–5.1)
Phosphorus: 3.6 mg/dL (ref 2.5–4.6)
Sodium: 139 mmol/L (ref 135–145)

## 2015-09-22 LAB — IRON AND TIBC
Iron: 71 ug/dL (ref 28–170)
SATURATION RATIOS: 25 % (ref 10.4–31.8)
TIBC: 286 ug/dL (ref 250–450)
UIBC: 215 ug/dL

## 2015-09-22 LAB — POCT HEMOGLOBIN-HEMACUE: HEMOGLOBIN: 10.4 g/dL — AB (ref 12.0–15.0)

## 2015-09-22 LAB — FERRITIN: FERRITIN: 579 ng/mL — AB (ref 11–307)

## 2015-09-22 MED ORDER — DARBEPOETIN ALFA 200 MCG/0.4ML IJ SOSY
PREFILLED_SYRINGE | INTRAMUSCULAR | Status: AC
  Filled 2015-09-22: qty 0.4

## 2015-09-22 MED ORDER — DARBEPOETIN ALFA 200 MCG/0.4ML IJ SOSY
200.0000 ug | PREFILLED_SYRINGE | INTRAMUSCULAR | Status: DC
  Administered 2015-09-22: 200 ug via SUBCUTANEOUS

## 2015-10-12 ENCOUNTER — Other Ambulatory Visit: Payer: Self-pay | Admitting: Nephrology

## 2015-10-12 DIAGNOSIS — D638 Anemia in other chronic diseases classified elsewhere: Secondary | ICD-10-CM

## 2015-10-20 ENCOUNTER — Encounter (HOSPITAL_COMMUNITY)
Admission: RE | Admit: 2015-10-20 | Discharge: 2015-10-20 | Disposition: A | Discharge status: Home / Self Care | Payer: Medicare Other | Source: Ambulatory Visit | Attending: Nephrology | Admitting: Nephrology

## 2015-10-20 DIAGNOSIS — D631 Anemia in chronic kidney disease: Secondary | ICD-10-CM | POA: Diagnosis not present

## 2015-10-20 DIAGNOSIS — D638 Anemia in other chronic diseases classified elsewhere: Secondary | ICD-10-CM

## 2015-10-20 DIAGNOSIS — Z79899 Other long term (current) drug therapy: Secondary | ICD-10-CM | POA: Diagnosis not present

## 2015-10-20 DIAGNOSIS — Z5181 Encounter for therapeutic drug level monitoring: Secondary | ICD-10-CM | POA: Insufficient documentation

## 2015-10-20 DIAGNOSIS — N185 Chronic kidney disease, stage 5: Secondary | ICD-10-CM | POA: Diagnosis present

## 2015-10-20 LAB — RENAL FUNCTION PANEL
ALBUMIN: 3.8 g/dL (ref 3.5–5.0)
ANION GAP: 9 (ref 5–15)
BUN: 69 mg/dL — ABNORMAL HIGH (ref 6–20)
CALCIUM: 10.1 mg/dL (ref 8.9–10.3)
CO2: 23 mmol/L (ref 22–32)
Chloride: 104 mmol/L (ref 101–111)
Creatinine, Ser: 4.4 mg/dL — ABNORMAL HIGH (ref 0.44–1.00)
GFR calc non Af Amer: 9 mL/min — ABNORMAL LOW (ref >60)
GFR, EST AFRICAN AMERICAN: 10 mL/min — AB (ref >60)
Glucose, Bld: 97 mg/dL (ref 65–99)
PHOSPHORUS: 3.6 mg/dL (ref 2.5–4.6)
Potassium: 3.6 mmol/L (ref 3.5–5.1)
SODIUM: 136 mmol/L (ref 135–145)

## 2015-10-20 LAB — POCT HEMOGLOBIN-HEMACUE: Hemoglobin: 10.5 g/dL — ABNORMAL LOW (ref 12.0–15.0)

## 2015-10-20 LAB — IRON AND TIBC
IRON: 91 ug/dL (ref 28–170)
Saturation Ratios: 33 % — ABNORMAL HIGH (ref 10.4–31.8)
TIBC: 274 ug/dL (ref 250–450)
UIBC: 183 ug/dL

## 2015-10-20 MED ORDER — DARBEPOETIN ALFA 200 MCG/0.4ML IJ SOSY
PREFILLED_SYRINGE | INTRAMUSCULAR | Status: AC
  Filled 2015-10-20: qty 0.4

## 2015-10-20 MED ORDER — DARBEPOETIN ALFA 200 MCG/0.4ML IJ SOSY
200.0000 ug | PREFILLED_SYRINGE | INTRAMUSCULAR | Status: DC
  Administered 2015-10-20: 200 ug via SUBCUTANEOUS

## 2015-11-08 ENCOUNTER — Encounter (HOSPITAL_COMMUNITY): Payer: Self-pay | Admitting: Emergency Medicine

## 2015-11-08 ENCOUNTER — Encounter (HOSPITAL_COMMUNITY): Payer: Self-pay

## 2015-11-08 ENCOUNTER — Emergency Department (HOSPITAL_COMMUNITY): Payer: Medicare Other

## 2015-11-08 ENCOUNTER — Emergency Department (HOSPITAL_COMMUNITY)
Admission: EM | Admit: 2015-11-08 | Discharge: 2015-11-09 | Disposition: A | Discharge status: Home / Self Care | Payer: Medicare Other | Source: Home / Self Care | Attending: Emergency Medicine | Admitting: Emergency Medicine

## 2015-11-08 ENCOUNTER — Emergency Department (HOSPITAL_COMMUNITY)
Admission: EM | Admit: 2015-11-08 | Discharge: 2015-11-08 | Disposition: A | Discharge status: Home / Self Care | Payer: Medicare Other | Attending: Emergency Medicine | Admitting: Emergency Medicine

## 2015-11-08 DIAGNOSIS — S82851A Displaced trimalleolar fracture of right lower leg, initial encounter for closed fracture: Secondary | ICD-10-CM | POA: Insufficient documentation

## 2015-11-08 DIAGNOSIS — N189 Chronic kidney disease, unspecified: Secondary | ICD-10-CM

## 2015-11-08 DIAGNOSIS — E039 Hypothyroidism, unspecified: Secondary | ICD-10-CM | POA: Insufficient documentation

## 2015-11-08 DIAGNOSIS — W19XXXA Unspecified fall, initial encounter: Secondary | ICD-10-CM | POA: Insufficient documentation

## 2015-11-08 DIAGNOSIS — Y9301 Activity, walking, marching and hiking: Secondary | ICD-10-CM | POA: Insufficient documentation

## 2015-11-08 DIAGNOSIS — Y999 Unspecified external cause status: Secondary | ICD-10-CM

## 2015-11-08 DIAGNOSIS — Z79899 Other long term (current) drug therapy: Secondary | ICD-10-CM

## 2015-11-08 DIAGNOSIS — Y9289 Other specified places as the place of occurrence of the external cause: Secondary | ICD-10-CM | POA: Diagnosis not present

## 2015-11-08 DIAGNOSIS — Y939 Activity, unspecified: Secondary | ICD-10-CM

## 2015-11-08 DIAGNOSIS — S82899A Other fracture of unspecified lower leg, initial encounter for closed fracture: Secondary | ICD-10-CM

## 2015-11-08 DIAGNOSIS — I129 Hypertensive chronic kidney disease with stage 1 through stage 4 chronic kidney disease, or unspecified chronic kidney disease: Secondary | ICD-10-CM | POA: Insufficient documentation

## 2015-11-08 DIAGNOSIS — W1839XA Other fall on same level, initial encounter: Secondary | ICD-10-CM | POA: Insufficient documentation

## 2015-11-08 DIAGNOSIS — Z23 Encounter for immunization: Secondary | ICD-10-CM | POA: Insufficient documentation

## 2015-11-08 DIAGNOSIS — Y929 Unspecified place or not applicable: Secondary | ICD-10-CM | POA: Insufficient documentation

## 2015-11-08 DIAGNOSIS — S99911A Unspecified injury of right ankle, initial encounter: Secondary | ICD-10-CM | POA: Diagnosis present

## 2015-11-08 MED ORDER — ATORVASTATIN CALCIUM 20 MG PO TABS
20.0000 mg | ORAL_TABLET | Freq: Every day | ORAL | Status: DC
  Administered 2015-11-08 – 2015-11-09 (×2): 20 mg via ORAL
  Filled 2015-11-08 (×2): qty 1

## 2015-11-08 MED ORDER — TRIAMTERENE 50 MG PO CAPS
50.0000 mg | ORAL_CAPSULE | Freq: Two times a day (BID) | ORAL | Status: DC
  Filled 2015-11-08: qty 1

## 2015-11-08 MED ORDER — HYDRALAZINE HCL 50 MG PO TABS
75.0000 mg | ORAL_TABLET | Freq: Three times a day (TID) | ORAL | Status: DC
  Filled 2015-11-08 (×2): qty 1

## 2015-11-08 MED ORDER — METOPROLOL TARTRATE 25 MG PO TABS
50.0000 mg | ORAL_TABLET | Freq: Two times a day (BID) | ORAL | Status: DC
  Administered 2015-11-08 – 2015-11-09 (×3): 50 mg via ORAL
  Filled 2015-11-08 (×3): qty 2

## 2015-11-08 MED ORDER — LEVOTHYROXINE SODIUM 75 MCG PO TABS
75.0000 ug | ORAL_TABLET | Freq: Every day | ORAL | Status: DC
  Administered 2015-11-09: 75 ug via ORAL
  Filled 2015-11-08 (×2): qty 1

## 2015-11-08 MED ORDER — TRAMADOL HCL 50 MG PO TABS: 50.0000 mg | ORAL_TABLET | Freq: Two times a day (BID) | ORAL | 0 refills | Status: DC | PRN

## 2015-11-08 MED ORDER — SODIUM CHLORIDE 0.9 % IV BOLUS (SEPSIS)
1000.0000 mL | Freq: Once | INTRAVENOUS | Status: AC
  Administered 2015-11-08: 1000 mL via INTRAVENOUS

## 2015-11-08 MED ORDER — ALLOPURINOL 100 MG PO TABS
100.0000 mg | ORAL_TABLET | Freq: Every day | ORAL | Status: DC
  Administered 2015-11-08 – 2015-11-09 (×2): 100 mg via ORAL
  Filled 2015-11-08 (×2): qty 1

## 2015-11-08 MED ORDER — HYDRALAZINE HCL 50 MG PO TABS
100.0000 mg | ORAL_TABLET | Freq: Three times a day (TID) | ORAL | Status: DC
  Administered 2015-11-08 – 2015-11-09 (×3): 100 mg via ORAL
  Filled 2015-11-08 (×4): qty 2

## 2015-11-08 MED ORDER — KETAMINE HCL-SODIUM CHLORIDE 100-0.9 MG/10ML-% IV SOSY
2.0000 mg/kg | PREFILLED_SYRINGE | Freq: Once | INTRAVENOUS | Status: AC
  Administered 2015-11-08: 80 mg via INTRAVENOUS
  Filled 2015-11-08: qty 20

## 2015-11-08 MED ORDER — NEBIVOLOL HCL 10 MG PO TABS: 10.0000 mg | ORAL_TABLET | Freq: Every day | ORAL | Status: DC

## 2015-11-08 MED ORDER — TETANUS-DIPHTH-ACELL PERTUSSIS 5-2.5-18.5 LF-MCG/0.5 IM SUSP
0.5000 mL | Freq: Once | INTRAMUSCULAR | Status: AC
  Administered 2015-11-08: 0.5 mL via INTRAMUSCULAR
  Filled 2015-11-08: qty 0.5

## 2015-11-08 MED ORDER — TRAMADOL HCL 50 MG PO TABS
50.0000 mg | ORAL_TABLET | Freq: Two times a day (BID) | ORAL | Status: DC | PRN
  Administered 2015-11-08: 50 mg via ORAL
  Filled 2015-11-08 (×2): qty 1

## 2015-11-08 MED ORDER — AMLODIPINE BESYLATE 10 MG PO TABS
10.0000 mg | ORAL_TABLET | Freq: Every day | ORAL | Status: DC
  Administered 2015-11-08 – 2015-11-09 (×2): 10 mg via ORAL
  Filled 2015-11-08 (×2): qty 1

## 2015-11-08 MED ORDER — FUROSEMIDE 40 MG PO TABS
80.0000 mg | ORAL_TABLET | Freq: Two times a day (BID) | ORAL | Status: DC
  Administered 2015-11-08 – 2015-11-09 (×3): 80 mg via ORAL
  Filled 2015-11-08 (×3): qty 2

## 2015-11-08 MED ORDER — HYDRALAZINE HCL 50 MG PO TABS
100.0000 mg | ORAL_TABLET | Freq: Three times a day (TID) | ORAL | Status: DC
  Filled 2015-11-08: qty 2

## 2015-11-08 MED ORDER — PANTOPRAZOLE SODIUM 40 MG PO TBEC
40.0000 mg | DELAYED_RELEASE_TABLET | Freq: Every day | ORAL | Status: DC
  Administered 2015-11-08 – 2015-11-09 (×2): 40 mg via ORAL
  Filled 2015-11-08 (×2): qty 1

## 2015-11-08 NOTE — Clinical Social Work Note (Signed)
Clinical Social Work Assessment  Patient Details  Name: Bonnie Alvarez MRN: [id number] Date of Birth: 08-23-1940  Date of referral:  11/08/15               Reason for consult:  Discharge Planning                Permission sought to share information with:  Facility Art therapist granted to share information::  Yes, Verbal Permission Granted  Name::        Agency::     Relationship::     Contact Information:     Housing/Transportation Living arrangements for the past 2 months:  [place] of Information:  Patient, Adult Children Patient Interpreter Needed:  None Criminal Activity/Legal Involvement Pertinent to Current Situation/Hospitalization:    Significant Relationships:  Adult Children Lives with:  Self Do you feel safe going back to the place where you live?  No Need for family participation in patient care:  Yes (Comment)  Care giving concerns:  CSW received consult to speak with patient regarding SNF placement.    Social Worker assessment / plan:  CSW spoke with patient and patients family at bedside regarding placement. Patient lives at [facility] independent living where she has an aid multiple times a week. Patient was discharged early this morning and brought back to the ED by EMS the same day. EMS stated that patient was unable to take care of herself at her current independent living. Patient and patients family are wanting her to receive short-term rehab before return to her independent living.  Employment status:  Retired Nurse, adult PT Recommendations:  Not assessed at this time Information / Referral to community resources:  [place]  Patient/Family's Response to care:  Patient and patients family were appreciative of CSW.   Patient/Family's Understanding of and Emotional Response to Diagnosis, Current Treatment, and Prognosis:  Patient understands her current  treatment and prognosis.   Emotional Assessment Appearance:  Appears stated age Attitude/Demeanor/Rapport:    Affect (typically observed):  Pleasant, Calm, Accepting Orientation:  Oriented to Self, Oriented to Place, Oriented to  Time, Oriented to Situation Alcohol / Substance use:    Psych involvement (Current and /or in the community):  No (Comment)  Discharge Needs  Concerns to be addressed:  Discharge Planning Concerns Readmission within the last 30 days:  No Current discharge risk:  None Barriers to Discharge:  No Barriers Identified   Weston Anna, LCSW 11/08/2015, 5:26 PM

## 2015-11-08 NOTE — ED Triage Notes (Signed)
She had fallen, and was treated in our E.D. Today for tri-malleolar fx with closed reduction.  Upon arrival to her Independent living, it was found by EMT's to be insufficient for pt. Safe care, so they returned here.  Pt. Is smiling as she tells [place] she is happy to be here.  Cap. Refill all toes bilat. Is immediate and her short leg/foot splint is intact.

## 2015-11-08 NOTE — ED Notes (Signed)
She has used the badpan a couple of times.  We give a complete bed change again, as her sheets were a bit wet. Cap. Refill all toes bilat. Remains immediate.

## 2015-11-08 NOTE — ED Provider Notes (Signed)
[facility] DEPT Provider Note   CSN: [id number] Arrival date & time: 11/08/15  0411     History   Chief Complaint Chief Complaint  Patient presents with  . Fall  . Ankle Pain    HPI Bonnie Alvarez is a 75 y.o. female with no significant past medical history presented today after a fall. She lives in an assisted living facility. She fell while walking to the bathroom. She states that her right ankle gave out from under her. She denies any prodromal symptoms. she denies any loss of consciousness. She now has a gross deformity to her right ankle. She states she has no pain as long as it is not moved. She denies any previous history of orthopedic injuries. She denies being on blood thinners.  10 Systems reviewed and are negative for acute change except as noted in the HPI.    HPI  Past Medical History:  Diagnosis Date  . Anemia   . Chronic kidney disease    secondary hyperparathyroidism  . GERD (gastroesophageal reflux disease)   . Gout, unspecified   . Hyperlipidemia   . Hypertension   . Hypothyroid   . Monoclonal gammopathy   . Obesity   . Osteoarthritis   . Secondary hyperparathyroidism [facility])     Patient Active Problem List   Diagnosis Date Noted  . Anemia of chronic disease 10/12/2015    Past Surgical History:  Procedure Laterality Date  . BACK SURGERY    . COLONOSCOPY W/ POLYPECTOMY      OB History    No data available       Home Medications    Prior to Admission medications   Medication Sig Start Date End Date Taking? Authorizing Provider  allopurinol (ZYLOPRIM) 100 MG tablet Take 100 mg by mouth daily. 08/26/12   Historical Provider, MD  amLODipine (NORVASC) 10 MG tablet Take 10 mg by mouth daily.    Historical Provider, MD  atorvastatin (LIPITOR) 20 MG tablet Take 20 mg by mouth daily. 08/26/12   Historical Provider, MD  BYSTOLIC 10 MG tablet Take 10 mg by mouth daily. 08/26/12   Historical Provider, MD  esomeprazole (NEXIUM) 40 MG capsule Take  40 mg by mouth daily before breakfast.    Historical Provider, MD  furosemide (LASIX) 40 MG tablet Take 80 mg by mouth 2 (two) times daily.    Historical Provider, MD  hydrALAZINE (APRESOLINE) 50 MG tablet Take 75 mg by mouth 3 (three) times daily.     Historical Provider, MD  levothyroxine (SYNTHROID, LEVOTHROID) 75 MCG tablet Take 75 mcg by mouth daily before breakfast.    Historical Provider, MD  metoprolol (LOPRESSOR) 50 MG tablet Take 50 mg by mouth 2 (two) times daily.    Historical Provider, MD  triamterene (DYRENIUM) 50 MG capsule Take 50 mg by mouth 2 (two) times daily.    Historical Provider, MD    Family History No family history on file.  Social History Social History  Substance Use Topics  . Smoking status: Never Smoker  . Smokeless tobacco: Never Used  . Alcohol use No     Allergies   Caduet [amlodipine-atorvastatin]; Hydrocodone bitart (antituss) [hydrocodone]; Lyrica [pregabalin]; and Penicillins   Review of Systems Review of Systems   Physical Exam Updated Vital Signs BP 174/80 (BP Location: Right Arm)   Pulse 88   Temp 99.3 F (37.4 C) (Oral)   Resp 22   Ht 5\' 6"  (1.676 m)   Wt 178 lb (80.7 kg)   SpO2  100%   BMI 28.73 kg/m   Physical Exam  Constitutional: She is oriented to person, place, and time. She appears well-developed and well-nourished. No distress.  HENT:  Head: Normocephalic and atraumatic.  Nose: Nose normal.  Mouth/Throat: Oropharynx is clear and moist. No oropharyngeal exudate.  Eyes: Conjunctivae and EOM are normal. Pupils are equal, round, and reactive to light. No scleral icterus.  Neck: Normal range of motion. Neck supple. No JVD present. No tracheal deviation present. No thyromegaly present.  Cardiovascular: Normal rate, regular rhythm and normal heart sounds.  Exam reveals no gallop and no friction rub.   No murmur heard. Pulmonary/Chest: Effort normal and breath sounds normal. No respiratory distress. She has no wheezes. She  exhibits no tenderness.  Abdominal: Soft. Bowel sounds are normal. She exhibits no distension and no mass. There is no tenderness. There is no rebound and no guarding.  Musculoskeletal: She exhibits edema and tenderness.  Obvious deformity to the right ankle. Normal pulses and sensation distally. Patient able to wiggle toes.  Lymphadenopathy:    She has no cervical adenopathy.  Neurological: She is alert and oriented to person, place, and time. No cranial nerve deficit. She exhibits normal muscle tone.  Skin: Skin is warm and dry. No rash noted. No erythema. No pallor.  Nursing note and vitals reviewed.    ED Treatments / Results  Labs (all labs ordered are listed, but only abnormal results are displayed) Labs Reviewed - No data to display  EKG  EKG Interpretation None       Radiology Dg Ankle 2 Views Right  Result Date: 11/08/2015 CLINICAL DATA:  Fall with twisting injury while walking to the bathroom at her home. Deformity. EXAM: RIGHT ANKLE - 2 VIEW COMPARISON:  None. FINDINGS: Displaced trimalleolar fracture. Distal fibular fracture just proximal to the ankle mortise is comminuted, displaced and minimally angulated. Medial malleolus fracture primarily transverse in orientation, distal fracture fragment remains aligned with the talus. There is lateral talar subluxation and apex medial angulation. Posterior tibial tubercle fracture is comminuted. Soft tissue edema at the fracture site. IMPRESSION: Displaced trimalleolar fracture with lateral subluxation and angulation of the talus with respect to the distal tibia. Electronically Signed   By: Jeb Levering M.D.   On: 11/08/2015 04:54    Procedures Procedures (including critical care time)  Medications Ordered in ED Medications  Tdap (BOOSTRIX) injection 0.5 mL (not administered)  ketamine 100 mg in normal saline 10 mL (10mg /mL) syringe (80 mg Intravenous Given 11/08/15 0620)  sodium chloride 0.9 % bolus 1,000 mL (1,000 mLs  Intravenous New Bag/Given 11/08/15 0610)     Initial Impression / Assessment and Plan / ED Course  I have reviewed the triage vital signs and the nursing notes.  Pertinent labs & imaging results that were available during my care of the patient were reviewed by me and considered in my medical decision making (see chart for details).  Clinical Course    Patient presents to the emergency department for falling and obvious deformity to the right ankle. X-ray reveals a trimalleolar fracture. Will sedate him reduce. Will await orthopedic surgery for further recommendations.  7:08 AM I spoke with Dr. Fredonia Highland who states patient is safe for DC, NWB, and follow up in clinic this week.  This was relayed to the patient.  Case Management consulted to help with equipment at nursing facility.  Patient is safe for DC.   Procedural sedation Performed by: Everlene Balls Consent: Verbal consent obtained. Risks and benefits: risks,  benefits and alternatives were discussed Required items: required blood products, implants, devices, and special equipment available Patient identity confirmed: arm band and provided demographic data Time out: Immediately prior to procedure a "time out" was called to verify the correct patient, procedure, equipment, support staff and site/side marked as required.  Sedation type: moderate (conscious) sedation NPO time confirmed and considedered  Sedatives: [place]   Physician Time at Bedside: 33min  Vitals: Vital signs were monitored during sedation. Cardiac Monitor, pulse oximeter Patient tolerance: Patient tolerated the procedure well with no immediate complications. Comments: Pt with uneventful recovered. Returned to pre-procedural sedation baseline   Reduction of dislocation Date/Time: 6:41 AM Performed by: Everlene Balls Authorized byEverlene Balls Consent: Verbal consent obtained. Risks and benefits: risks, benefits and alternatives were discussed Consent given by:  patient Required items: required blood products, implants, devices, and special equipment available Time out: Immediately prior to procedure a "time out" was called to verify the correct patient, procedure, equipment, support staff and site/side marked as required.  Patient sedated: Ketamine  Vitals: Vital signs were monitored during sedation. Patient tolerance: Patient tolerated the procedure well with no immediate complications. Joint: R ankle Reduction technique: traction-counter traction with manipulation    Final Clinical Impressions(s) / ED Diagnoses   Final diagnoses:  Fall  Fx ankle    New Prescriptions New Prescriptions   No medications on file     Everlene Balls, MD 11/08/15 [phone]

## 2015-11-08 NOTE — ED Notes (Signed)
We have just assisted her onto an inpatient hospital bed with skin setting ON.  Her son and daughter in law and their son remain with her.  All toes bilat. Warm with immediate cap. Refill.

## 2015-11-08 NOTE — NC FL2 (Signed)
[place] LEVEL OF CARE SCREENING TOOL     IDENTIFICATION  Patient Name: Bonnie Alvarez Birthdate: 30-Apr-1940 Sex: female Admission Date (Current Location): 11/08/2015  [facility] and [place] Number:  Herbalist and Address:  [facility]  [place] [address] [place]      Provider Number: [id number]  Attending Physician Name and Address:  Provider Default, MD  Relative Name and Phone Number:       Current Level of Care: Hospital Recommended Level of Care: [place] Prior Approval Number:    Date Approved/Denied:   PASRR Number:   [id number]  Discharge Plan: SNF    Current Diagnoses: Patient Active Problem List   Diagnosis Date Noted  . Anemia of chronic disease 10/12/2015    Orientation RESPIRATION BLADDER Height & Weight     Self, Time, Situation, Place  Normal Continent Weight:   Height:     BEHAVIORAL SYMPTOMS/MOOD NEUROLOGICAL BOWEL NUTRITION STATUS      Continent Diet (Regular)  AMBULATORY STATUS COMMUNICATION OF NEEDS Skin   Limited Assist Verbally Normal                       Personal Care Assistance Level of Assistance  Bathing, Dressing Bathing Assistance: Limited assistance   Dressing Assistance: Limited assistance     Functional Limitations Info             SPECIAL CARE FACTORS FREQUENCY  PT (By licensed PT), OT (By licensed OT)     PT Frequency: 5 OT Frequency: 5            Contractures      Additional Factors Info  Code Status, Allergies Code Status Info: Not on file  Allergies Info: Allergies: Hydrocodone Bitart (Antituss) Hydrocodone, Lyrica Pregabalin, Penicillins           Current Medications (11/08/2015):  This is the current hospital active medication list Current Facility-Administered Medications  Medication Dose Route Frequency Provider Last Rate Last Dose  . allopurinol (ZYLOPRIM) tablet 100 mg  100 mg Oral Daily Davonna Belling, MD      .  amLODipine (NORVASC) tablet 10 mg  10 mg Oral Daily Davonna Belling, MD      . atorvastatin (LIPITOR) tablet 20 mg  20 mg Oral Daily Davonna Belling, MD      . furosemide (LASIX) tablet 80 mg  80 mg Oral BID Davonna Belling, MD   80 mg at 11/08/15 1613  . hydrALAZINE (APRESOLINE) tablet 100 mg  100 mg Oral TID Davonna Belling, MD      . Derrill Memo ON 11/09/2015] levothyroxine (SYNTHROID, LEVOTHROID) tablet 75 mcg  75 mcg Oral QAC breakfast Davonna Belling, MD      . metoprolol tartrate (LOPRESSOR) tablet 50 mg  50 mg Oral BID Davonna Belling, MD   50 mg at 11/08/15 1612  . pantoprazole (PROTONIX) EC tablet 40 mg  40 mg Oral Daily Davonna Belling, MD   40 mg at 11/08/15 1612  . traMADol (ULTRAM) tablet 50 mg  50 mg Oral Q12H PRN Davonna Belling, MD       Current Outpatient Prescriptions  Medication Sig Dispense Refill  . allopurinol (ZYLOPRIM) 100 MG tablet Take 100 mg by mouth 2 (two) times daily.     Marland Kitchen amLODipine (NORVASC) 10 MG tablet Take 5 mg by mouth every evening.     Marland Kitchen atorvastatin (LIPITOR) 20 MG tablet Take 20 mg by mouth every evening.     Marland Kitchen  esomeprazole (NEXIUM) 40 MG capsule Take 40 mg by mouth daily before breakfast.    . furosemide (LASIX) 40 MG tablet Take 80 mg by mouth daily.     . hydrALAZINE (APRESOLINE) 100 MG tablet Take 100 mg by mouth 3 (three) times daily.    Marland Kitchen levothyroxine (SYNTHROID, LEVOTHROID) 75 MCG tablet Take 75 mcg by mouth daily before breakfast.    . metoprolol (LOPRESSOR) 50 MG tablet Take 50 mg by mouth 2 (two) times daily.    . traMADol (ULTRAM) 50 MG tablet Take 1 tablet (50 mg total) by mouth every 12 (twelve) hours as needed for severe pain. (Patient not taking: Reported on 11/08/2015) 10 tablet 0     Discharge Medications:   Relevant Imaging Results:  Relevant Lab Results:   Additional Information: [phone]    Weston Anna, LCSW

## 2015-11-08 NOTE — Progress Notes (Addendum)
Spoke with son and daughter in law about going to advanced DME store after calling about 1 pm Discussed DMe staff states orders are being processed and DME available at store Pt had a donated w/c with no cushion  DME includes light wt w/c with cushion, 3n1 and elevated toilet seat Discussed step stool preferrably at [place] supply or [facility] at best price

## 2015-11-08 NOTE — ED Triage Notes (Signed)
Pt brought in by PTAR after a fall about 20 minutes ago.  Lives at [facility] in independent living.  Denies LOC and dizziness.  States legs buckled out from under her on the way to the bathroom.  Noted deformity to right ankle per PTAR.  Splinted in route.

## 2015-11-08 NOTE — Progress Notes (Signed)
Patient suffers from generalized weakness on the left extremities and found 11/08/15 right ankle fracture after a fall Past medical history of chronic kidney disease, gout and osteoarthritis which impairs their ability to perform daily activities like mobility, walking, dressing and bathing in the home.  A cane, crutch nor wheelchair will not resolve  issue with performing activities of daily living. A wheelchair will allow patient to safely perform daily activities. Patient is not able to propel themselves in the home using a standard weight wheelchair due to eneralized weakness on the left extremities and found 11/08/15 right ankle fracture after a fall plus patient reports limited space in home . Patient can self propel in the lightweight wheelchair.  Accessories: elevating leg rests (ELRs), wheel locks, extensions and anti-tippers.  Ht 5'9" wt 178 lb

## 2015-11-08 NOTE — ED Notes (Signed)
Phone number of daughter-in-law [phone] or [phone]

## 2015-11-08 NOTE — ED Notes (Signed)
Bed: [facility] Expected date:  Expected time:  Means of arrival:  Comments: EMS fall/ankle pain

## 2015-11-08 NOTE — Progress Notes (Addendum)
Spoke with staff at pt pcp office to get fax to fax EDP note to [phone]

## 2015-11-08 NOTE — Progress Notes (Signed)
Called and LVM for Tim for pt choice of home health agency for [facility], [place] with Jermaine [phone] about DME 3n1 and w/c orders Jermaine states the the items can be obtained from [facility] in which [place] and [place] pt caregivers are familiar with

## 2015-11-08 NOTE — Progress Notes (Signed)
Contact from [employer] about pt referral accepted Pt states she has a pCS provider in her home now Cm discussed why Cm unable to order [facility] related to already having a PCS.   Cm discussed differences in home health and hospice Discussed criteria for home health and criteria for hospice/palliative services

## 2015-11-08 NOTE — Progress Notes (Signed)
CM reviewed in details medicare guidelines, Choices of home health [facility]) (length of stay in home, types of [facility] staff available, coverage, primary caregiver, up to 24 hrs before services may be started) and choices of Private duty nursing (PDN-coverage, length of stay in the home types of staff available). CM reviewed availability of [place] SW to assist pcp to get pt to snf (if desired disposition) from the community level. CM provided pt/family with a list of [place] home health agencies

## 2015-11-08 NOTE — ED Notes (Signed)
Bonnie Alvarez and I assist her in changing her bed, as it was a bit wet.  Pt. Is awake, alert and in no distress and thanks [place] for our care.  She has a couple visiting her; and they are aware we are awaiting the arrival of a case manager to arrange for whatever follow-up assistance she may need.  (She came from independent living).  All toes bilat. Are warm and have immediate cap. Refill.  Splint in place right lower leg/foot.

## 2015-11-08 NOTE — Progress Notes (Addendum)
EDP consulted Cm after stating pt returned to ED after getting home and stating she is not able to take care of herself at home. EDP asking ED CM about placement ED CM does not assist with placement Referred EDP to ED SW and HHSW added to home health orders  Pt continues to have personal care services at home Cm encouraged pt and family to speak with pcp about assistance in increasing personal care services Pt during CM assessment with her son and daughter in law at bedside had refused to go to her son's home and stated her preference was to go to her home.

## 2015-11-08 NOTE — Progress Notes (Signed)
ED CM updated [place] staff, Tim of pt return to ED and with request for placement assist, plus addition of HHSW in EPIC To keep him updated

## 2015-11-08 NOTE — ED Provider Notes (Signed)
[facility]-EMERGENCY DEPT Provider Note   CSN: [id number] Arrival date & time: 11/08/15  1324     History   Chief Complaint Chief Complaint  Patient presents with  . Leg Injury    HPI Bonnie Alvarez is a 75 y.o. female.  HPI  patient was  Seen in the ER earlier today. Had a Trimalleolar ankle fracture. Was cleared to go back to the assisted living. Case management had arranged home health follow-up. Reportedly was taken home and the EMTs realize she could not bear weight or get into bed on her own. Brought back here. Reportedly was not safe at home. Patient has no new complaints.  Past Medical History:  Diagnosis Date  . Anemia   . Chronic kidney disease    secondary hyperparathyroidism  . GERD (gastroesophageal reflux disease)   . Gout, unspecified   . Hyperlipidemia   . Hypertension   . Hypothyroid   . Monoclonal gammopathy   . Obesity   . Osteoarthritis   . Secondary hyperparathyroidism [facility])     Patient Active Problem List   Diagnosis Date Noted  . Anemia of chronic disease 10/12/2015    Past Surgical History:  Procedure Laterality Date  . BACK SURGERY    . COLONOSCOPY W/ POLYPECTOMY      OB History    No data available       Home Medications    Prior to Admission medications   Medication Sig Start Date End Date Taking? Authorizing Provider  allopurinol (ZYLOPRIM) 100 MG tablet Take 100 mg by mouth daily. 08/26/12  Yes Historical Provider, MD  amLODipine (NORVASC) 10 MG tablet Take 5 mg by mouth every evening.    Yes Historical Provider, MD  atorvastatin (LIPITOR) 20 MG tablet Take 20 mg by mouth every evening.  08/26/12  Yes Historical Provider, MD  BYSTOLIC 10 MG tablet Take 10 mg by mouth daily. 08/26/12  Yes Historical Provider, MD  esomeprazole (NEXIUM) 40 MG capsule Take 40 mg by mouth daily before breakfast.   Yes Historical Provider, MD  furosemide (LASIX) 40 MG tablet Take 80 mg by mouth daily.    Yes Historical Provider, MD  hydrALAZINE  (APRESOLINE) 50 MG tablet Take 50 mg by mouth 3 (three) times daily.    Yes Historical Provider, MD  levothyroxine (SYNTHROID, LEVOTHROID) 75 MCG tablet Take 75 mcg by mouth daily before breakfast.   Yes Historical Provider, MD  metoprolol (LOPRESSOR) 50 MG tablet Take 50 mg by mouth 2 (two) times daily.   Yes Historical Provider, MD  triamterene (DYRENIUM) 50 MG capsule Take 50 mg by mouth 2 (two) times daily.   Yes Historical Provider, MD  traMADol (ULTRAM) 50 MG tablet Take 1 tablet (50 mg total) by mouth every 12 (twelve) hours as needed for severe pain. Patient not taking: Reported on 11/08/2015 11/08/15   Tomasita Crumble, MD    Family History No family history on file.  Social History Social History  Substance Use Topics  . Smoking status: Never Smoker  . Smokeless tobacco: Never Used  . Alcohol use No     Allergies   Caduet [amlodipine-atorvastatin]; Hydrocodone bitart (antituss) [hydrocodone]; Lyrica [pregabalin]; and Penicillins   Review of Systems Review of Systems  Constitutional: Negative for fever.  Respiratory: Negative for shortness of breath.   Cardiovascular: Negative for chest pain.  Musculoskeletal:       Right foot pain  Hematological: Negative for adenopathy.     Physical Exam Updated Vital Signs There were no vitals taken  for this visit.  Physical Exam  Constitutional:  Patient is resting pleasantly with her right lower leg in a splint.  Cardiovascular: Normal rate.   Pulmonary/Chest: Effort normal.  Neurological: She is alert.  Psychiatric:  Patient is awake and pleasant.     ED Treatments / Results  Labs (all labs ordered are listed, but only abnormal results are displayed) Labs Reviewed - No data to display  EKG  EKG Interpretation None       Radiology Dg Ankle 2 Views Right  Result Date: 11/08/2015 CLINICAL DATA:  Fall with twisting injury while walking to the bathroom at her home. Deformity. EXAM: RIGHT ANKLE - 2 VIEW COMPARISON:   None. FINDINGS: Displaced trimalleolar fracture. Distal fibular fracture just proximal to the ankle mortise is comminuted, displaced and minimally angulated. Medial malleolus fracture primarily transverse in orientation, distal fracture fragment remains aligned with the talus. There is lateral talar subluxation and apex medial angulation. Posterior tibial tubercle fracture is comminuted. Soft tissue edema at the fracture site. IMPRESSION: Displaced trimalleolar fracture with lateral subluxation and angulation of the talus with respect to the distal tibia. Electronically Signed   By: Jeb Levering M.D.   On: 11/08/2015 04:54   Dg Ankle Right Port  Result Date: 11/08/2015 CLINICAL DATA:  Fracture, postreduction. EXAM: PORTABLE RIGHT ANKLE - 2 VIEW COMPARISON:  Pre reduction radiographs earlier this day. FINDINGS: Improved alignment of the trimalleolar fracture postreduction. There is minimal residual lateral talar subluxation and talar tilt. Overlying cast material partially obscures osseous and soft tissue fine detail. IMPRESSION: Improved alignment of trimalleolar fracture postreduction with mild residual talar subluxation and talar tilt. Electronically Signed   By: Jeb Levering M.D.   On: 11/08/2015 06:46    Procedures Procedures (including critical care time)  Medications Ordered in ED Medications  allopurinol (ZYLOPRIM) tablet 100 mg (not administered)  amLODipine (NORVASC) tablet 10 mg (not administered)  atorvastatin (LIPITOR) tablet 20 mg (not administered)  nebivolol (BYSTOLIC) tablet 10 mg (not administered)  pantoprazole (PROTONIX) EC tablet 40 mg (40 mg Oral Given 11/08/15 1612)  furosemide (LASIX) tablet 80 mg (80 mg Oral Given 11/08/15 1613)  hydrALAZINE (APRESOLINE) tablet 75 mg (not administered)  levothyroxine (SYNTHROID, LEVOTHROID) tablet 75 mcg (not administered)  metoprolol tartrate (LOPRESSOR) tablet 50 mg (50 mg Oral Given 11/08/15 1612)  traMADol (ULTRAM) tablet 50 mg  (not administered)  triamterene (DYRENIUM) capsule 50 mg (not administered)     Initial Impression / Assessment and Plan / ED Course  I have reviewed the triage vital signs and the nursing notes.  Pertinent labs & imaging results that were available during my care of the patient were reviewed by me and considered in my medical decision making (see chart for details).  Clinical Course    Patient with fall last night. Trimalleolar ankle fracture. Reportedly was taken home but per EMT and patient could not manage at home. Previous home health and been arranged but may need placement. To be seen by social work. Care turned over to Dr. Eulis Foster.  Final Clinical Impressions(s) / ED Diagnoses   Final diagnoses:  Closed fracture of ankle, trimalleolar, right, initial encounter    New Prescriptions New Prescriptions   No medications on file     Davonna Belling, MD 11/08/15 [phone]

## 2015-11-08 NOTE — Progress Notes (Signed)
Orthopedic Tech Progress Note Patient Details:  Bonnie Alvarez [birth year] [id number]  Ortho Devices Type of Ortho Device: Post (short leg) splint, Stirrup splint Ortho Device/Splint Location: rle Ortho Device/Splint Interventions: Ordered, Application   Karolee Stamps 11/08/2015, 6:29 AM

## 2015-11-08 NOTE — ED Notes (Signed)
Bed: [id number] Expected date:  Expected time:  Means of arrival:  Comments: EMS- 75yo F, ankle fracture/recent d/c

## 2015-11-08 NOTE — ED Notes (Signed)
Pt family at bedside

## 2015-11-09 NOTE — Evaluation (Signed)
Physical Therapy Evaluation Patient Details Name: Bonnie Alvarez MRN: [id number] DOB: 1940/08/15 Today's Date: 11/09/2015   History of Present Illness  Pt s/p R ankle fx and with hx of L side weakness (LE>UE which pt relates to previous back surgey)  Clinical Impression  Pt s/p R ankle fx and presents with functional mobility limitations 2* NWB status on R LE and weakness involving L side extremities.  Pt is essentially nonambulatory at this time and with very limited assist at home will need follow up rehab at SNF level.    Follow Up Recommendations SNF    Equipment Recommendations  None recommended by PT    Recommendations for Other Services       Precautions / Restrictions Precautions Precautions: Fall Restrictions Weight Bearing Restrictions: Yes RLE Weight Bearing: Non weight bearing      Mobility  Bed Mobility Overal bed mobility: Needs Assistance;+ 2 for safety/equipment Bed Mobility: Supine to Sit;Sit to Supine     Supine to sit: Min assist;Mod assist;+2 for physical assistance;+2 for safety/equipment Sit to supine: Min assist;Mod assist;+2 for physical assistance;+2 for safety/equipment   General bed mobility comments: cues for sequence with physical assist to manage bilat LEs and to control trunk  Transfers                 General transfer comment: Pt sitting at EOB but standing not attempted.  Pt states "L leg will not hold me"  Ambulation/Gait                Stairs            Wheelchair Mobility    Modified Rankin (Stroke Patients Only)       Balance Overall balance assessment: Needs assistance Sitting-balance support: Feet supported;No upper extremity supported Sitting balance-Leahy Scale: Good                                       Pertinent Vitals/Pain Pain Assessment: Faces Faces Pain Scale: Hurts little more Pain Location: R ankle with movement Pain Descriptors / Indicators: Grimacing Pain  Intervention(s): Limited activity within patient's tolerance;Monitored during session    Home Living Family/patient expects to be discharged to:: Skilled nursing facility                      Prior Function Level of Independence: Independent with assistive device(s)         Comments: Pt used cane and RW for mobility in IND living facility     Hand Dominance   Dominant Hand: Right    Extremity/Trunk Assessment   Upper Extremity Assessment: LUE deficits/detail       LUE Deficits / Details: AROM WFL with strength 3+-4-/5   Lower Extremity Assessment: RLE deficits/detail;LLE deficits/detail RLE Deficits / Details: ankle/foot splinted LLE Deficits / Details: AAROM WFL but with strength ~2/5  Cervical / Trunk Assessment: Normal  Communication   Communication: No difficulties  Cognition Arousal/Alertness: Awake/alert Behavior During Therapy: WFL for tasks assessed/performed Overall Cognitive Status: Within Functional Limits for tasks assessed                      General Comments      Exercises        Assessment/Plan    PT Assessment Patient needs continued PT services  PT Diagnosis Difficulty walking   PT Problem List Decreased strength;Decreased mobility;Decreased knowledge of  use of DME;Decreased knowledge of precautions;Obesity;Pain  PT Treatment Interventions DME instruction;Functional mobility training;Therapeutic activities;Therapeutic exercise;Patient/family education   PT Goals (Current goals can be found in the Care Plan section) Acute Rehab PT Goals Patient Stated Goal: Rehab setting to regain IND PT Goal Formulation: With patient Time For Goal Achievement: 11/16/15 Potential to Achieve Goals: Fair    Frequency Min 3X/week   Barriers to discharge        Co-evaluation               End of Session   Activity Tolerance: Patient tolerated treatment well Patient left: in bed;with call bell/phone within reach Nurse  Communication: Mobility status    Functional Assessment Tool Used: Clinical judgement Functional Limitation: Mobility: Walking and moving around Mobility: Walking and Moving Around Current Status [id number]): At least 60 percent but less than 80 percent impaired, limited or restricted Mobility: Walking and Moving Around Goal Status [phone]): At least 40 percent but less than 60 percent impaired, limited or restricted    Time: [id number] PT Time Calculation (min) (ACUTE ONLY): 30 min   Charges:   PT Evaluation $PT Eval Low Complexity: 1 Procedure     PT G Codes:   PT G-Codes **NOT FOR INPATIENT CLASS** Functional Assessment Tool Used: Clinical judgement Functional Limitation: Mobility: Walking and moving around Mobility: Walking and Moving Around Current Status [id number]): At least 60 percent but less than 80 percent impaired, limited or restricted Mobility: Walking and Moving Around Goal Status [phone]): At least 40 percent but less than 60 percent impaired, limited or restricted    Aleiya Rye 11/09/2015, 9:32 AM

## 2015-11-09 NOTE — Progress Notes (Signed)
CSW spoke with patient regarding placement at [facility] and [facility]. She stated the place of choice does not matter to her, however, she states she would like to speak with her son. CSW spoke with son as well and he stated he wanted to visit the facilities to make a choice and he would call back.  CSW spoke with Bonnie Medicus, RN Liaison with Bonnie Alvarez and [facility] regarding patient. She stated what information she would need for patient to be admitted.  CSW received a call from Bonnie Alvarez, daughter-in-law of patient who stated they were familiar with [facility] and they were going to go to that facility as well.  Call received from Bonnie Alvarez with [facility] and she stated patient's son and daughter-in-law chose their facility. CSW spoke with the daughter-in-law Bonnie Alvarez to verify this information and she stated they chose [facility]. Spoke back with Bonnie Alvarez and she stated they would need the [facility] faxed to their facility.   CSW spoke with patient to inform her that her family chose [facility] and she was fine with the decision.  CSW attempted to fax four times with no results. Spoke with Bonnie Alvarez and she stated she could view the FL2 in EPIC. She provided a number for Nurse to call report [phone]. Nurse was given number to call report and informed that the AVS would need to go with patient when transported to the facility. Nurse was also informed that Bonnie Alvarez in Admissions stated patient could be sent to their facility at this time.   CSW spoke with patient, son, and daughter-in-law to inform them that patient could be sent to the facility at this time. Patient and family were appreciative.     Bonnie Alvarez [id number] ED CSW 11/09/2015 12:39 PM

## 2015-11-09 NOTE — ED Notes (Signed)
PT and SW in with patient.

## 2015-11-09 NOTE — Discharge Instructions (Signed)
NO WEIGHT BEARING FOLLOW-UP WITH DR. Percell Miller THIS WEEK

## 2015-11-10 ENCOUNTER — Encounter: Payer: Self-pay | Admitting: Internal Medicine

## 2015-11-10 ENCOUNTER — Non-Acute Institutional Stay (SKILLED_NURSING_FACILITY): Payer: Medicare Other | Admitting: Internal Medicine

## 2015-11-10 DIAGNOSIS — R2681 Unsteadiness on feet: Secondary | ICD-10-CM

## 2015-11-10 DIAGNOSIS — I10 Essential (primary) hypertension: Secondary | ICD-10-CM | POA: Diagnosis not present

## 2015-11-10 DIAGNOSIS — E785 Hyperlipidemia, unspecified: Secondary | ICD-10-CM | POA: Diagnosis not present

## 2015-11-10 DIAGNOSIS — M1A9XX0 Chronic gout, unspecified, without tophus (tophi): Secondary | ICD-10-CM

## 2015-11-10 DIAGNOSIS — S82851K Displaced trimalleolar fracture of right lower leg, subsequent encounter for closed fracture with nonunion: Secondary | ICD-10-CM

## 2015-11-10 DIAGNOSIS — N185 Chronic kidney disease, stage 5: Secondary | ICD-10-CM | POA: Diagnosis not present

## 2015-11-10 DIAGNOSIS — D638 Anemia in other chronic diseases classified elsewhere: Secondary | ICD-10-CM

## 2015-11-10 DIAGNOSIS — K219 Gastro-esophageal reflux disease without esophagitis: Secondary | ICD-10-CM

## 2015-11-10 DIAGNOSIS — E038 Other specified hypothyroidism: Secondary | ICD-10-CM

## 2015-11-10 NOTE — Progress Notes (Signed)
LOCATION: [facility]  PCP: Jilda Panda, MD   Code Status: Full Code  Goals of care: Advanced Directive information Advanced Directives 11/08/2015  Does patient have an advance directive? No  Would patient like information on creating an advanced directive? No - patient declined information       Extended Emergency Contact Information Primary Emergency Contact: Lorayne Marek, [place] Mobile Phone: [phone] Relation: Son Secondary Emergency Contact: Laverta [place] Phone: [phone] Mobile Phone: [phone] Relation: Relative   Allergies  Allergen Reactions  . Hydrocodone Bitart (Antituss) [Hydrocodone] Other (See Comments)    unknown  . Lyrica [Pregabalin] Other (See Comments)    unknown  . Penicillins Other (See Comments)    Has patient had a PCN reaction causing immediate rash, facial/tongue/throat swelling, SOB or lightheadedness with hypotension: unknown Has patient had a PCN reaction causing severe rash involving mucus membranes or skin necrosis: unknown Has patient had a PCN reaction that required hospitalization: unknown Has patient had a PCN reaction occurring within the last 10 years: no If all of the above answers are "NO", then may proceed with Cephalosporin use.     Chief Complaint  Patient presents with  . New Admit To SNF    New Admission     HPI:  Patient is a 75 y.o. female seen today for short term rehabilitation post ED visit on 11/08/15 with right closed trimalleolar fracture. She underwent closed reduction on 11/08/15 and is here for rehabilitation. She is NWB to RLE and is seen by orthopedic service today. She is to undergo ORIF on 11/14/15. She is seen in her room today with her son and daughter present. Her pain is under control with current pain regimen. She denies any concern.   Review of Systems:  Constitutional: Negative for fever, chills, diaphoresis.    HENT: Negative for headache, congestion, nasal discharge, difficulty swallowing.   Eyes: Negative for blurred vision, double vision and discharge.  Respiratory: Negative for cough, shortness of breath and wheezing.   Cardiovascular: Negative for chest pain, palpitations, leg swelling.  Gastrointestinal: Negative for heartburn, nausea, vomiting, abdominal pain and constipation.  Genitourinary: Negative for dysuria and flank pain.  Musculoskeletal: Negative for back pain  Skin: Negative for itching, rash.  Neurological: Negative for dizziness. Psychiatric/Behavioral: Negative for depression   Past Medical History:  Diagnosis Date  . Anemia   . Chronic kidney disease    secondary hyperparathyroidism  . GERD (gastroesophageal reflux disease)   . Gout, unspecified   . Hyperlipidemia   . Hypertension   . Hypothyroid   . Monoclonal gammopathy   . Obesity   . Osteoarthritis   . Secondary hyperparathyroidism [facility])    Past Surgical History:  Procedure Laterality Date  . BACK SURGERY    . COLONOSCOPY W/ POLYPECTOMY     Social History:   reports that she has never smoked. She has never used smokeless tobacco. She reports that she does not drink alcohol or use drugs.  No family history on file.  Medications:   Medication List       Accurate as of 11/10/15  3:39 PM. Always use your most recent med list.          allopurinol 100 MG tablet Commonly known as:  ZYLOPRIM Take 100 mg by mouth 2 (two) times daily.   amLODipine 10 MG tablet Commonly known as:  NORVASC Take 5 mg by mouth  every evening.   atorvastatin 20 MG tablet Commonly known as:  LIPITOR Take 20 mg by mouth every evening.   esomeprazole 40 MG capsule Commonly known as:  NEXIUM Take 40 mg by mouth daily before breakfast.   furosemide 40 MG tablet Commonly known as:  LASIX Take 80 mg by mouth daily.   hydrALAZINE 100 MG tablet Commonly known as:  APRESOLINE Take 100 mg by mouth 3 (three) times daily.    levothyroxine 75 MCG tablet Commonly known as:  SYNTHROID, LEVOTHROID Take 75 mcg by mouth daily before breakfast.   metoprolol 50 MG tablet Commonly known as:  LOPRESSOR Take 50 mg by mouth 2 (two) times daily.   traMADol 50 MG tablet Commonly known as:  ULTRAM Take 1 tablet (50 mg total) by mouth every 12 (twelve) hours as needed for severe pain.       Immunizations: Immunization History  Administered Date(s) Administered  . Tdap 11/08/2015     Physical Exam:  Vitals:   11/10/15 1534  BP: (!) 141/71  Pulse: 79  Resp: 18  Temp: 100.2 F (37.9 C)  TempSrc: Oral  SpO2: 97%  Weight: 178 lb (80.7 kg)  Height: 5\' 6"  (1.676 m)   Body mass index is 28.73 kg/m.  General- elderly female, overweight, in no acute distress Head- normocephalic, atraumatic Nose- no maxillary or frontal sinus tenderness, no nasal discharge Throat- moist mucus membrane Eyes- PERRLA, EOMI, no pallor, no icterus, no discharge, normal conjunctiva, normal sclera Neck- no cervical lymphadenopathy Cardiovascular- normal s1,s2, no murmur, no leg edema Respiratory- bilateral clear to auscultation, no wheeze, no rhonchi, no crackles, no use of accessory muscles Abdomen- bowel sounds present, soft, non tender Musculoskeletal- able to move all 4 extremities, right foot in cast, able to move her toes and has good capillary refill Neurological- alert and oriented to person, place and time Skin- warm and dry Psychiatry- normal mood and affect    Labs reviewed: Basic Metabolic Panel:  Recent Labs  08/25/15 1035 09/22/15 1030 10/20/15 1030  NA 138 139 136  K 3.7 4.1 3.6  CL 103 107 104  CO2 23 23 23   GLUCOSE 107* 100* 97  BUN 68* 69* 69*  CREATININE 5.09* 4.23* 4.40*  CALCIUM 10.1  9.7 10.5* 10.1  PHOS 3.6 3.6 3.6   Liver Function Tests:  Recent Labs  08/25/15 1035 09/22/15 1030 10/20/15 1030  ALBUMIN 3.6 3.5 3.8   No results for input(s): LIPASE, AMYLASE in the last 8760  hours. No results for input(s): AMMONIA in the last 8760 hours. CBC:  Recent Labs  08/25/15 1034 09/22/15 1028 10/20/15 1023  HGB 9.8* 10.4* 10.5*   Cardiac Enzymes: No results for input(s): CKTOTAL, CKMB, CKMBINDEX, TROPONINI in the last 8760 hours. BNP: Invalid input(s): POCBNP CBG: No results for input(s): GLUCAP in the last 8760 hours.  Radiological Exams: Dg Ankle 2 Views Right  Result Date: 11/08/2015 CLINICAL DATA:  Fall with twisting injury while walking to the bathroom at her home. Deformity. EXAM: RIGHT ANKLE - 2 VIEW COMPARISON:  None. FINDINGS: Displaced trimalleolar fracture. Distal fibular fracture just proximal to the ankle mortise is comminuted, displaced and minimally angulated. Medial malleolus fracture primarily transverse in orientation, distal fracture fragment remains aligned with the talus. There is lateral talar subluxation and apex medial angulation. Posterior tibial tubercle fracture is comminuted. Soft tissue edema at the fracture site. IMPRESSION: Displaced trimalleolar fracture with lateral subluxation and angulation of the talus with respect to the distal tibia. Electronically Signed  By: Jeb Levering M.D.   On: 11/08/2015 04:54   Dg Ankle Right Port  Result Date: 11/08/2015 CLINICAL DATA:  Fracture, postreduction. EXAM: PORTABLE RIGHT ANKLE - 2 VIEW COMPARISON:  Pre reduction radiographs earlier this day. FINDINGS: Improved alignment of the trimalleolar fracture postreduction. There is minimal residual lateral talar subluxation and talar tilt. Overlying cast material partially obscures osseous and soft tissue fine detail. IMPRESSION: Improved alignment of trimalleolar fracture postreduction with mild residual talar subluxation and talar tilt. Electronically Signed   By: Jeb Levering M.D.   On: 11/08/2015 06:46    Assessment/Plan  Unsteady gait Will have patient work with PT/OT as tolerated to regain strength and restore function.  Fall  precautions are in place.  Right trimalleolar fracture S/p trial of closed reduction and is awaiting ORIF on 11/11/15. Continue tramadol 50 mg bid prn pain. NWB to RLE. Will have her work with physical therapy and occupational therapy team to help with gait training and muscle strengthening exercises.fall precautions. Skin care. Encourage to be out of bed.   HTN Monitor bp bid, continue amlodipine 5 mg daily, hydralazine 100 mg tid with lopressor 50 mg bid and lasix 80 mg daily, check bmp  HLD Continue lipitor 20 mg daily  Hypothyroidism Continue synthroid 75 mcg daily  gerd Stable, continue nexium 40 mg daily  Chronic gout Continue allopurinol 100 mg bid  Anemia of chronic disease Monitor cbc  ckd stage 5 Monitor bmp   Goals of care: short term rehabilitation   Labs/tests ordered: cbc, cmp  Family/ staff Communication: reviewed care plan with patient, her family and nursing supervisor    Blanchie Serve, MD Internal Medicine [place], [place] [address] Cell Phone (Monday-Friday 8 am - 5 pm): [phone] On Call: [phone] and follow prompts after 5 pm and on weekends Office Phone: [phone] Office Fax: [phone]

## 2015-11-13 NOTE — Progress Notes (Signed)
Several unsuccessful attempts have been made to contact pt; pre-op instructions were left on pt voice mailbox according to pre-op checklist. Pt made aware to stop taking Aspirin, vitamins, fish oil and herbal medications. Do not take any NSAIDs ie: Ibuprofen, Advil, Naproxen, BC and Goody powder or any medication containing Aspirin. Please complete pt assessment on DOS.

## 2015-11-13 NOTE — H&P (Signed)
[facility] [address] [place],  [address] [phone] A Division of [facility]   RE: Bonnie, Alvarez   [id number]      DOB: October 26, 1940  INITIAL EVALUATION:  11-10-15  REASON FOR VISIT: Right ankle injury. Referral from [facility] Emergency Room.  Date of injury 11-08-15.   HPI: Bonnie Alvarez is a 75 year old woman who suffered a slip and mechanical fall when going to the bathroom. She complains of pain at the right ankle.      Please see associated documentation for this clinic visit for further past medical, family, surgical and social history, review of systems, and exam findings as this was reviewed by me.  EXAMINATION: Well appearing female no apparent distress.  Neurovascularly intact. Her skin is benign.    IMAGES: X-rays reviewed by me:   X-rays from the emergency room demonstrate persistent lateral displacement after a closed reduction performed there.   ASSESSMENT/PLAN: I will perform a closed reduction today. We will schedule her for an open reduction internal fixation of this trimalleolar ankle fracture without fixation of the posterior lip.   PROCEDURE:  After and appropriate time out, using a sterile technique, I injected 5 cc of Marcaine mixed with 5 cc of Lidocaine into her right ankle. I then performed a closed reduction of her ankle with splinting. X-rays showed an appropriate reduction.   Ernesta Amble.  Percell Miller, M.D.  Electronically verified by Ernesta Amble. Percell Miller, M.D.  TDMDelorise Royals  D  11-10-15 T  11-10-15

## 2015-11-13 NOTE — Anesthesia Preprocedure Evaluation (Addendum)
Anesthesia Evaluation  Patient identified by MRN, date of birth, ID band Patient awake    Reviewed: Allergy & Precautions, NPO status , Patient's Chart, lab work & pertinent test results, reviewed documented beta blocker date and time   History of Anesthesia Complications Negative for: history of anesthetic complications  Airway Mallampati: III  TM Distance: >3 FB Neck ROM: Full    Dental  (+) Partial Upper, Dental Advisory Given   Pulmonary neg pulmonary ROS, neg shortness of breath, neg sleep apnea, neg COPD, neg recent URI,    Pulmonary exam normal breath sounds clear to auscultation       Cardiovascular hypertension, Pt. on medications and Pt. on home beta blockers (-) angina(-) Past MI, (-) Cardiac Stents, (-) Orthopnea and (-) PND  Rhythm:Regular Rate:Normal     Neuro/Psych neg Seizures negative neurological ROS     GI/Hepatic Neg liver ROS, GERD  Medicated and Controlled,  Endo/Other  neg diabetesHypothyroidism Secondary hyperparathyroidism  Renal/GU CRFRenal disease     Musculoskeletal  (+) Arthritis , Osteoarthritis,    Abdominal   Peds  Hematology  (+) Blood dyscrasia, anemia , Monoclonal gammopathy   Anesthesia Other Findings Gout, HLD  Reproductive/Obstetrics                            Anesthesia Physical Anesthesia Plan  ASA: III  Anesthesia Plan: Regional   Post-op Pain Management:  Regional for Post-op pain   Induction:   Airway Management Planned: Natural Airway and Nasal Cannula  Additional Equipment:   Intra-op Plan:   Post-operative Plan: Extubation in OR  Informed Consent: I have reviewed the patients History and Physical, chart, labs and discussed the procedure including the risks, benefits and alternatives for the proposed anesthesia with the patient or authorized representative who has indicated his/her understanding and acceptance.   Dental advisory  given  Plan Discussed with:   Anesthesia Plan Comments:       Anesthesia Quick Evaluation

## 2015-11-14 ENCOUNTER — Encounter (HOSPITAL_COMMUNITY)
Admission: RE | Disposition: A | Discharge status: Home / Self Care | Payer: Self-pay | Source: Ambulatory Visit | Attending: Orthopedic Surgery

## 2015-11-14 ENCOUNTER — Ambulatory Visit (HOSPITAL_COMMUNITY): Payer: Medicare Other | Admitting: Anesthesiology

## 2015-11-14 ENCOUNTER — Observation Stay (HOSPITAL_COMMUNITY)
Admission: RE | Admit: 2015-11-14 | Discharge: 2015-11-15 | Disposition: A | Discharge status: Home / Self Care | Payer: Medicare Other | Source: Ambulatory Visit | Attending: Orthopedic Surgery | Admitting: Orthopedic Surgery

## 2015-11-14 ENCOUNTER — Encounter (HOSPITAL_COMMUNITY): Payer: Self-pay | Admitting: *Deleted

## 2015-11-14 DIAGNOSIS — N189 Chronic kidney disease, unspecified: Secondary | ICD-10-CM | POA: Insufficient documentation

## 2015-11-14 DIAGNOSIS — Z7982 Long term (current) use of aspirin: Secondary | ICD-10-CM | POA: Diagnosis not present

## 2015-11-14 DIAGNOSIS — X501XXA Overexertion from prolonged static or awkward postures, initial encounter: Secondary | ICD-10-CM | POA: Insufficient documentation

## 2015-11-14 DIAGNOSIS — E785 Hyperlipidemia, unspecified: Secondary | ICD-10-CM | POA: Diagnosis not present

## 2015-11-14 DIAGNOSIS — D638 Anemia in other chronic diseases classified elsewhere: Secondary | ICD-10-CM | POA: Diagnosis present

## 2015-11-14 DIAGNOSIS — W19XXXA Unspecified fall, initial encounter: Secondary | ICD-10-CM | POA: Diagnosis not present

## 2015-11-14 DIAGNOSIS — I129 Hypertensive chronic kidney disease with stage 1 through stage 4 chronic kidney disease, or unspecified chronic kidney disease: Secondary | ICD-10-CM | POA: Insufficient documentation

## 2015-11-14 DIAGNOSIS — M1990 Unspecified osteoarthritis, unspecified site: Secondary | ICD-10-CM | POA: Diagnosis not present

## 2015-11-14 DIAGNOSIS — S8251XA Displaced fracture of medial malleolus of right tibia, initial encounter for closed fracture: Secondary | ICD-10-CM | POA: Diagnosis present

## 2015-11-14 DIAGNOSIS — S82891A Other fracture of right lower leg, initial encounter for closed fracture: Secondary | ICD-10-CM | POA: Diagnosis present

## 2015-11-14 DIAGNOSIS — E669 Obesity, unspecified: Secondary | ICD-10-CM | POA: Diagnosis not present

## 2015-11-14 DIAGNOSIS — K219 Gastro-esophageal reflux disease without esophagitis: Secondary | ICD-10-CM | POA: Diagnosis not present

## 2015-11-14 DIAGNOSIS — Z6828 Body mass index (BMI) 28.0-28.9, adult: Secondary | ICD-10-CM | POA: Insufficient documentation

## 2015-11-14 DIAGNOSIS — N2581 Secondary hyperparathyroidism of renal origin: Secondary | ICD-10-CM | POA: Diagnosis not present

## 2015-11-14 DIAGNOSIS — E039 Hypothyroidism, unspecified: Secondary | ICD-10-CM | POA: Insufficient documentation

## 2015-11-14 DIAGNOSIS — E1122 Type 2 diabetes mellitus with diabetic chronic kidney disease: Secondary | ICD-10-CM | POA: Diagnosis not present

## 2015-11-14 HISTORY — PX: ORIF ANKLE FRACTURE: SHX5408

## 2015-11-14 LAB — BASIC METABOLIC PANEL
Anion gap: 12 (ref 5–15)
BUN: 75 mg/dL — AB (ref 6–20)
CO2: 25 mmol/L (ref 22–32)
CREATININE: 4.3 mg/dL — AB (ref 0.44–1.00)
Calcium: 10 mg/dL (ref 8.9–10.3)
Chloride: 99 mmol/L — ABNORMAL LOW (ref 101–111)
GFR calc Af Amer: 11 mL/min — ABNORMAL LOW (ref >60)
GFR, EST NON AFRICAN AMERICAN: 9 mL/min — AB (ref >60)
GLUCOSE: 127 mg/dL — AB (ref 65–99)
POTASSIUM: 4.2 mmol/L (ref 3.5–5.1)
SODIUM: 136 mmol/L (ref 135–145)

## 2015-11-14 LAB — CBC
HCT: 31.2 % — ABNORMAL LOW (ref 36.0–46.0)
Hemoglobin: 10 g/dL — ABNORMAL LOW (ref 12.0–15.0)
MCH: 29.7 pg (ref 26.0–34.0)
MCHC: 32.1 g/dL (ref 30.0–36.0)
MCV: 92.6 fL (ref 78.0–100.0)
PLATELETS: 324 10*3/uL (ref 150–400)
RBC: 3.37 MIL/uL — ABNORMAL LOW (ref 3.87–5.11)
RDW: 15.7 % — ABNORMAL HIGH (ref 11.5–15.5)
WBC: 11.6 10*3/uL — ABNORMAL HIGH (ref 4.0–10.5)

## 2015-11-14 LAB — SURGICAL PCR SCREEN
MRSA, PCR: NEGATIVE
Staphylococcus aureus: NEGATIVE

## 2015-11-14 SURGERY — OPEN REDUCTION INTERNAL FIXATION (ORIF) ANKLE FRACTURE
Anesthesia: Regional | Site: Ankle | Laterality: Right

## 2015-11-14 MED ORDER — ACETAMINOPHEN 325 MG PO TABS: 650.0000 mg | ORAL_TABLET | Freq: Four times a day (QID) | ORAL | Status: DC | PRN

## 2015-11-14 MED ORDER — FENTANYL CITRATE (PF) 100 MCG/2ML IJ SOLN
INTRAMUSCULAR | Status: AC
  Filled 2015-11-14: qty 2

## 2015-11-14 MED ORDER — CEFAZOLIN SODIUM-DEXTROSE 2-4 GM/100ML-% IV SOLN
2.0000 g | INTRAVENOUS | Status: AC
  Administered 2015-11-14: 2 g via INTRAVENOUS
  Filled 2015-11-14: qty 100

## 2015-11-14 MED ORDER — SENNA 8.6 MG PO TABS
1.0000 | ORAL_TABLET | Freq: Two times a day (BID) | ORAL | Status: DC
  Administered 2015-11-14 – 2015-11-15 (×3): 8.6 mg via ORAL
  Filled 2015-11-14 (×4): qty 1

## 2015-11-14 MED ORDER — DEXTROSE 5 % IV SOLN
INTRAVENOUS | Status: DC | PRN
  Administered 2015-11-14: 25 ug/min via INTRAVENOUS

## 2015-11-14 MED ORDER — ATORVASTATIN CALCIUM 20 MG PO TABS
20.0000 mg | ORAL_TABLET | Freq: Every evening | ORAL | Status: DC
  Administered 2015-11-14: 20 mg via ORAL
  Filled 2015-11-14: qty 1

## 2015-11-14 MED ORDER — AMLODIPINE BESYLATE 5 MG PO TABS
5.0000 mg | ORAL_TABLET | Freq: Every evening | ORAL | Status: DC
  Administered 2015-11-14: 5 mg via ORAL
  Filled 2015-11-14: qty 1

## 2015-11-14 MED ORDER — MIDAZOLAM HCL 5 MG/ML IJ SOLN: 2.0000 mg | Freq: Once | INTRAMUSCULAR | Status: DC

## 2015-11-14 MED ORDER — BACLOFEN 10 MG PO TABS: 10.0000 mg | ORAL_TABLET | Freq: Three times a day (TID) | ORAL | Status: DC | PRN

## 2015-11-14 MED ORDER — PANTOPRAZOLE SODIUM 40 MG PO TBEC
40.0000 mg | DELAYED_RELEASE_TABLET | Freq: Every day | ORAL | Status: DC
  Administered 2015-11-14 – 2015-11-15 (×2): 40 mg via ORAL
  Filled 2015-11-14 (×2): qty 1

## 2015-11-14 MED ORDER — ACETAMINOPHEN 325 MG PO TABS
650.0000 mg | ORAL_TABLET | Freq: Four times a day (QID) | ORAL | Status: AC
  Administered 2015-11-14 – 2015-11-15 (×4): 650 mg via ORAL
  Filled 2015-11-14 (×4): qty 2

## 2015-11-14 MED ORDER — FENTANYL CITRATE (PF) 100 MCG/2ML IJ SOLN
100.0000 ug | Freq: Once | INTRAMUSCULAR | Status: AC
  Administered 2015-11-14: 50 ug via INTRAVENOUS

## 2015-11-14 MED ORDER — ONDANSETRON HCL 4 MG PO TABS: 4.0000 mg | ORAL_TABLET | Freq: Four times a day (QID) | ORAL | Status: DC | PRN

## 2015-11-14 MED ORDER — METOPROLOL TARTRATE 12.5 MG HALF TABLET
50.0000 mg | ORAL_TABLET | Freq: Once | ORAL | Status: AC
  Administered 2015-11-14: 50 mg via ORAL

## 2015-11-14 MED ORDER — CHLORHEXIDINE GLUCONATE 4 % EX LIQD: 60.0000 mL | Freq: Once | CUTANEOUS | Status: DC

## 2015-11-14 MED ORDER — ACETAMINOPHEN 650 MG RE SUPP: 650.0000 mg | Freq: Four times a day (QID) | RECTAL | Status: DC | PRN

## 2015-11-14 MED ORDER — ASPIRIN 325 MG PO TABS
325.0000 mg | ORAL_TABLET | Freq: Every day | ORAL | Status: DC
  Administered 2015-11-14 – 2015-11-15 (×2): 325 mg via ORAL
  Filled 2015-11-14 (×2): qty 1

## 2015-11-14 MED ORDER — LEVOTHYROXINE SODIUM 75 MCG PO TABS
75.0000 ug | ORAL_TABLET | Freq: Every day | ORAL | Status: DC
  Administered 2015-11-15: 75 ug via ORAL
  Filled 2015-11-14: qty 1

## 2015-11-14 MED ORDER — METOPROLOL TARTRATE 50 MG PO TABS
50.0000 mg | ORAL_TABLET | Freq: Two times a day (BID) | ORAL | Status: DC
  Administered 2015-11-14 – 2015-11-15 (×2): 50 mg via ORAL
  Filled 2015-11-14 (×2): qty 1

## 2015-11-14 MED ORDER — FUROSEMIDE 40 MG PO TABS
80.0000 mg | ORAL_TABLET | Freq: Every day | ORAL | Status: DC
  Administered 2015-11-14 – 2015-11-15 (×2): 80 mg via ORAL
  Filled 2015-11-14 (×2): qty 2

## 2015-11-14 MED ORDER — ASPIRIN EC 81 MG PO TBEC: 81.0000 mg | DELAYED_RELEASE_TABLET | Freq: Every day | ORAL | 0 refills | Status: DC

## 2015-11-14 MED ORDER — ONDANSETRON HCL 4 MG/2ML IJ SOLN
INTRAMUSCULAR | Status: DC | PRN
  Administered 2015-11-14: 4 mg via INTRAVENOUS

## 2015-11-14 MED ORDER — FENTANYL CITRATE (PF) 100 MCG/2ML IJ SOLN
INTRAMUSCULAR | Status: DC | PRN
  Administered 2015-11-14: 50 ug via INTRAVENOUS

## 2015-11-14 MED ORDER — PROPOFOL 500 MG/50ML IV EMUL
INTRAVENOUS | Status: DC | PRN
Start: 2015-11-14 — End: 2015-11-14
  Administered 2015-11-14: 75 ug/kg/min via INTRAVENOUS

## 2015-11-14 MED ORDER — TRAMADOL HCL 50 MG PO TABS
50.0000 mg | ORAL_TABLET | Freq: Four times a day (QID) | ORAL | Status: DC | PRN
  Administered 2015-11-15: 50 mg via ORAL
  Filled 2015-11-14: qty 1

## 2015-11-14 MED ORDER — METOPROLOL TARTRATE 50 MG PO TABS
ORAL_TABLET | ORAL | Status: AC
  Filled 2015-11-14: qty 1

## 2015-11-14 MED ORDER — ALLOPURINOL 100 MG PO TABS
100.0000 mg | ORAL_TABLET | Freq: Two times a day (BID) | ORAL | Status: DC
  Administered 2015-11-14 – 2015-11-15 (×2): 100 mg via ORAL
  Filled 2015-11-14 (×2): qty 1

## 2015-11-14 MED ORDER — ONDANSETRON HCL 4 MG/2ML IJ SOLN: 4.0000 mg | Freq: Once | INTRAMUSCULAR | Status: DC | PRN

## 2015-11-14 MED ORDER — MIDAZOLAM HCL 2 MG/2ML IJ SOLN
INTRAMUSCULAR | Status: AC
  Filled 2015-11-14: qty 2

## 2015-11-14 MED ORDER — 0.9 % SODIUM CHLORIDE (POUR BTL) OPTIME
TOPICAL | Status: DC | PRN
  Administered 2015-11-14: 1000 mL

## 2015-11-14 MED ORDER — SODIUM CHLORIDE 0.9 % IV SOLN
INTRAVENOUS | Status: DC
  Administered 2015-11-14: 10 mL/h via INTRAVENOUS

## 2015-11-14 MED ORDER — DOCUSATE SODIUM 100 MG PO CAPS
100.0000 mg | ORAL_CAPSULE | Freq: Two times a day (BID) | ORAL | Status: DC
  Administered 2015-11-14 – 2015-11-15 (×3): 100 mg via ORAL
  Filled 2015-11-14 (×3): qty 1

## 2015-11-14 MED ORDER — ONDANSETRON HCL 4 MG/2ML IJ SOLN: 4.0000 mg | Freq: Four times a day (QID) | INTRAMUSCULAR | Status: DC | PRN

## 2015-11-14 MED ORDER — METOCLOPRAMIDE HCL 5 MG PO TABS: 5.0000 mg | ORAL_TABLET | Freq: Three times a day (TID) | ORAL | Status: DC | PRN

## 2015-11-14 MED ORDER — BUPIVACAINE HCL (PF) 0.25 % IJ SOLN
INTRAMUSCULAR | Status: AC
  Filled 2015-11-14: qty 30

## 2015-11-14 MED ORDER — BUPIVACAINE-EPINEPHRINE (PF) 0.5% -1:200000 IJ SOLN
INTRAMUSCULAR | Status: DC | PRN
  Administered 2015-11-14: 30 mL via PERINEURAL
  Administered 2015-11-14: 20 mL via PERINEURAL

## 2015-11-14 MED ORDER — POVIDONE-IODINE 10 % EX SWAB: 2.0000 "application " | Freq: Once | CUTANEOUS | Status: DC

## 2015-11-14 MED ORDER — HYDRALAZINE HCL 50 MG PO TABS
100.0000 mg | ORAL_TABLET | Freq: Three times a day (TID) | ORAL | Status: DC
  Administered 2015-11-14 – 2015-11-15 (×3): 100 mg via ORAL
  Filled 2015-11-14 (×3): qty 2

## 2015-11-14 MED ORDER — ONDANSETRON HCL 4 MG/2ML IJ SOLN
INTRAMUSCULAR | Status: AC
  Filled 2015-11-14: qty 2

## 2015-11-14 MED ORDER — FENTANYL CITRATE (PF) 100 MCG/2ML IJ SOLN: 25.0000 ug | INTRAMUSCULAR | Status: DC | PRN

## 2015-11-14 MED ORDER — DEXTROSE-NACL 5-0.45 % IV SOLN: 100.0000 mL/h | INTRAVENOUS | Status: DC

## 2015-11-14 MED ORDER — ACETAMINOPHEN 500 MG PO TABS
1000.0000 mg | ORAL_TABLET | Freq: Once | ORAL | Status: AC
  Administered 2015-11-14: 1000 mg via ORAL
  Filled 2015-11-14: qty 2

## 2015-11-14 MED ORDER — MORPHINE SULFATE (PF) 2 MG/ML IV SOLN: 1.0000 mg | INTRAVENOUS | Status: DC | PRN

## 2015-11-14 MED ORDER — DOCUSATE SODIUM 100 MG PO CAPS: 100.0000 mg | ORAL_CAPSULE | Freq: Two times a day (BID) | ORAL | 0 refills | Status: DC

## 2015-11-14 MED ORDER — METOCLOPRAMIDE HCL 5 MG/ML IJ SOLN: 5.0000 mg | Freq: Three times a day (TID) | INTRAMUSCULAR | Status: DC | PRN

## 2015-11-14 MED ORDER — TRAMADOL HCL 50 MG PO TABS: 50.0000 mg | ORAL_TABLET | Freq: Four times a day (QID) | ORAL | 0 refills | Status: DC | PRN

## 2015-11-14 SURGICAL SUPPLY — 84 items
BANDAGE ACE 4X5 VEL STRL LF (GAUZE/BANDAGES/DRESSINGS) ×6 IMPLANT
BANDAGE ESMARK 6X9 LF (GAUZE/BANDAGES/DRESSINGS) ×1 IMPLANT
BIT DRILL 2.5X125 (BIT) ×3 IMPLANT
BIT DRILL 3.5X125 (BIT) ×1 IMPLANT
BIT DRILL CANN 2.7 (BIT) ×2
BIT DRILL CANN 2.7MM (BIT) ×1
BIT DRILL COUNTER SINK (DRILL) ×1 IMPLANT
BIT DRILL SRG 2.7XCANN AO CPLG (BIT) ×1 IMPLANT
BIT DRL SRG 2.7XCANN AO CPLNG (BIT) ×1
BLADE SURG 15 STRL LF DISP TIS (BLADE) ×2 IMPLANT
BLADE SURG 15 STRL SS (BLADE) ×4
BNDG CMPR 9X6 STRL LF SNTH (GAUZE/BANDAGES/DRESSINGS) ×1
BNDG CMPR MED 10X6 ELC LF (GAUZE/BANDAGES/DRESSINGS) ×1
BNDG COHESIVE 6X5 TAN STRL LF (GAUZE/BANDAGES/DRESSINGS) ×3 IMPLANT
BNDG ELASTIC 6X10 VLCR STRL LF (GAUZE/BANDAGES/DRESSINGS) ×3 IMPLANT
BNDG ESMARK 6X9 LF (GAUZE/BANDAGES/DRESSINGS) ×3
CANISTER SUCTION 2500CC (MISCELLANEOUS) ×3 IMPLANT
CLOSURE WOUND 1/2 X4 (GAUZE/BANDAGES/DRESSINGS) ×1
COVER SURGICAL LIGHT HANDLE (MISCELLANEOUS) ×3 IMPLANT
CUFF TOURNIQUET SINGLE 34IN LL (TOURNIQUET CUFF) ×3 IMPLANT
DRAPE C-ARM 42X72 X-RAY (DRAPES) IMPLANT
DRAPE C-ARMOR (DRAPES) IMPLANT
DRAPE INCISE IOBAN 66X45 STRL (DRAPES) ×3 IMPLANT
DRAPE OEC MINIVIEW 54X84 (DRAPES) ×3 IMPLANT
DRAPE ORTHO SPLIT 77X108 STRL (DRAPES) ×6
DRAPE SURG ORHT 6 SPLT 77X108 (DRAPES) ×2 IMPLANT
DRAPE U-SHAPE 47X51 STRL (DRAPES) ×3 IMPLANT
DRILL BIT 3.5X125 (BIT) ×3
DRILL COUNTER SINK (DRILL) ×3
DRSG ADAPTIC 3X8 NADH LF (GAUZE/BANDAGES/DRESSINGS) ×3 IMPLANT
DRSG PAD ABDOMINAL 8X10 ST (GAUZE/BANDAGES/DRESSINGS) ×3 IMPLANT
DURAPREP 26ML APPLICATOR (WOUND CARE) ×3 IMPLANT
ELECT REM PT RETURN 9FT ADLT (ELECTROSURGICAL) ×3
ELECTRODE REM PT RTRN 9FT ADLT (ELECTROSURGICAL) ×1 IMPLANT
GAUZE SPONGE 4X4 12PLY STRL (GAUZE/BANDAGES/DRESSINGS) ×3 IMPLANT
GLOVE BIO SURGEON STRL SZ7.5 (GLOVE) ×15 IMPLANT
GLOVE BIO SURGEON STRL SZ8 (GLOVE) ×3 IMPLANT
GLOVE BIOGEL PI IND STRL 6 (GLOVE) ×1 IMPLANT
GLOVE BIOGEL PI IND STRL 8 (GLOVE) ×2 IMPLANT
GLOVE BIOGEL PI INDICATOR 6 (GLOVE) ×2
GLOVE BIOGEL PI INDICATOR 8 (GLOVE) ×4
GLOVE SURG SS PI 6.0 STRL IVOR (GLOVE) ×3 IMPLANT
GOWN STRL REUS W/ TWL LRG LVL3 (GOWN DISPOSABLE) ×3 IMPLANT
GOWN STRL REUS W/ TWL XL LVL3 (GOWN DISPOSABLE) IMPLANT
GOWN STRL REUS W/TWL LRG LVL3 (GOWN DISPOSABLE) ×9
GOWN STRL REUS W/TWL XL LVL3 (GOWN DISPOSABLE)
K-WIRE ORTHOPEDIC 1.4X150L (WIRE) ×6
KIT BASIN OR (CUSTOM PROCEDURE TRAY) ×3 IMPLANT
KIT ROOM TURNOVER OR (KITS) ×3 IMPLANT
KWIRE ORTHOPEDIC 1.4X150L (WIRE) ×2 IMPLANT
MANIFOLD NEPTUNE II (INSTRUMENTS) ×3 IMPLANT
NEEDLE 22X1 1/2 (OR ONLY) (NEEDLE) ×3 IMPLANT
NS IRRIG 1000ML POUR BTL (IV SOLUTION) ×3 IMPLANT
PACK ORTHO EXTREMITY (CUSTOM PROCEDURE TRAY) ×3 IMPLANT
PAD ARMBOARD 7.5X6 YLW CONV (MISCELLANEOUS) ×6 IMPLANT
PAD CAST 4YDX4 CTTN HI CHSV (CAST SUPPLIES) ×1 IMPLANT
PADDING CAST COTTON 4X4 STRL (CAST SUPPLIES) ×3
PADDING CAST COTTON 6X4 STRL (CAST SUPPLIES) ×3 IMPLANT
PLATE 1/3 TUBULAR 7H (Plate) ×3 IMPLANT
SCREW CANC FT 16X4X2.5XHEX (Screw) ×3 IMPLANT
SCREW CANCELLOUS 4.0X14 (Screw) ×3 IMPLANT
SCREW CANCELLOUS 4.0X16MM (Screw) ×9 IMPLANT
SCREW CANCELLOUS 4.0X55MM (Screw) ×3 IMPLANT
SCREW CANNULATED TI 4.0X44 (Screw) ×6 IMPLANT
SCREW CORTEX ST MATTA 3.5X14 (Screw) ×9 IMPLANT
SCREW CORTEX ST MATTA 3.5X16MM (Screw) ×3 IMPLANT
SPONGE LAP 18X18 X RAY DECT (DISPOSABLE) ×3 IMPLANT
STRIP CLOSURE SKIN 1/2X4 (GAUZE/BANDAGES/DRESSINGS) ×2 IMPLANT
SUCTION FRAZIER HANDLE 10FR (MISCELLANEOUS) ×2
SUCTION TUBE FRAZIER 10FR DISP (MISCELLANEOUS) ×1 IMPLANT
SUT ETHILON 3 0 PS 1 (SUTURE) ×9 IMPLANT
SUT MNCRL AB 4-0 PS2 18 (SUTURE) ×3 IMPLANT
SUT MON AB 2-0 CT1 27 (SUTURE) IMPLANT
SUT VIC AB 0 CT1 27 (SUTURE)
SUT VIC AB 0 CT1 27XBRD ANBCTR (SUTURE) IMPLANT
SYR BULB IRRIGATION 50ML (SYRINGE) IMPLANT
SYR CONTROL 10ML LL (SYRINGE) IMPLANT
TOWEL OR 17X24 6PK STRL BLUE (TOWEL DISPOSABLE) ×3 IMPLANT
TOWEL OR 17X26 10 PK STRL BLUE (TOWEL DISPOSABLE) ×3 IMPLANT
TUBE CONNECTING 12'X1/4 (SUCTIONS) ×1
TUBE CONNECTING 12X1/4 (SUCTIONS) ×2 IMPLANT
UNDERPAD 30X30 (UNDERPADS AND DIAPERS) ×6 IMPLANT
WATER STERILE IRR 1000ML POUR (IV SOLUTION) ×3 IMPLANT
YANKAUER SUCT BULB TIP NO VENT (SUCTIONS) IMPLANT

## 2015-11-14 NOTE — Interval H&P Note (Signed)
History and Physical Interval Note:  11/14/2015 9:19 AM  Bonnie Alvarez  has presented today for surgery, with the diagnosis of RIGHT ANKLE FRACTURE  The various methods of treatment have been discussed with the patient and family. After consideration of risks, benefits and other options for treatment, the patient has consented to  Procedure(s): OPEN REDUCTION INTERNAL FIXATION (ORIF) ANKLE FRACTURE (Right) as a surgical intervention .  The patient's history has been reviewed, patient examined, no change in status, stable for surgery.  I have reviewed the patient's chart and labs.  Questions were answered to the patient's satisfaction.     Keauna Brasel D

## 2015-11-14 NOTE — Anesthesia Procedure Notes (Signed)
Anesthesia Regional Block:  Femoral nerve block  Pre-Anesthetic Checklist: ,, timeout performed, Correct Patient, Correct Site, Correct Laterality, Correct Procedure, Correct Position, site marked, Risks and benefits discussed,  Surgical consent,  Pre-op evaluation,  At surgeon's request and post-op pain management  Laterality: Right  Prep: chloraprep       Needles:  Injection technique: Single-shot  Needle Type: Echogenic Stimulator Needle     Needle Length: 9cm 9 cm Needle Gauge: 21 and 21 G    Additional Needles:  Procedures: ultrasound guided (picture in chart) Femoral nerve block Narrative:  Start time: 11/14/2015 9:00 AM End time: 11/14/2015 9:05 AM Injection made incrementally with aspirations every 5 mL.  Performed by: Personally  Anesthesiologist: Nilda Simmer

## 2015-11-14 NOTE — Anesthesia Procedure Notes (Signed)
Procedure Name: MAC Date/Time: 11/14/2015 10:31 AM Performed by: Kyung Rudd Pre-anesthesia Checklist: Patient identified, Emergency Drugs available, Suction available, Patient being monitored and Timeout performed Patient Re-evaluated:Patient Re-evaluated prior to inductionOxygen Delivery Method: Simple face mask Intubation Type: IV induction Placement Confirmation: positive ETCO2

## 2015-11-14 NOTE — Anesthesia Procedure Notes (Signed)
Anesthesia Regional Block:  Popliteal block  Pre-Anesthetic Checklist: ,, timeout performed, Correct Patient, Correct Site, Correct Laterality, Correct Procedure, Correct Position, site marked, Risks and benefits discussed,  Surgical consent,  Pre-op evaluation,  At surgeon's request and post-op pain management  Laterality: Right  Prep: chloraprep       Needles:  Injection technique: Single-shot  Needle Type: Echogenic Stimulator Needle     Needle Length: 9cm 9 cm Needle Gauge: 21 and 21 G    Additional Needles:  Procedures: ultrasound guided (picture in chart) Popliteal block Narrative:  Start time: 11/14/2015 8:55 AM End time: 11/14/2015 9:00 AM Injection made incrementally with aspirations every 5 mL.  Performed by: Personally  Anesthesiologist: Nilda Simmer  Additional Notes: Discussed potential risks of nerve blocks including, but not limited to, infection, bleeding, nerve damage, seizures, pneumothorax, respiratory depression, and potential failure of the block. Alternatives to nerve blocks discussed. All questions answered.

## 2015-11-14 NOTE — Discharge Instructions (Signed)
Elevate leg as much as possible.    Diet: As you were doing prior to hospitalization   Shower:  NO SOAKING IN TUB.    You have a splint on, leave the splint in place and keep the splint dry with a plastic bag.  Dressing:  You have a splint, then just leave the splint in place and we will change your bandages during your first follow-up appointment.    Activity:  Increase activity slowly as tolerated, but follow the weight bearing instructions below.  The rules on driving is that you can not be taking narcotics while you drive, and you must feel in control of the vehicle.    Weight Bearing:   Non weight bearing right leg  To prevent constipation: you may use a stool softener such as -  Colace (over the counter) 100 mg by mouth twice a day  Drink plenty of fluids (prune juice may be helpful) and high fiber foods Miralax (over the counter) for constipation as needed.    Itching:  If you experience itching with your medications, try taking only a single pain pill, or even half a pain pill at a time.  You may take up to 10 pain pills per day, and you can also use benadryl over the counter for itching or also to help with sleep.   Precautions:  If you experience chest pain or shortness of breath - call 911 immediately for transfer to the hospital emergency department!!  If you develop a fever greater that 101 F, purulent drainage from wound, increased redness or drainage from wound, or calf pain -- Call the office at [phone]                                                Follow- Up Appointment:  Please call for an appointment to be seen in 2 weeks [place] - [phone]

## 2015-11-14 NOTE — Op Note (Signed)
11/14/2015  12:47 PM  PATIENT:  Bonnie Alvarez    PRE-OPERATIVE DIAGNOSIS:  RIGHT ANKLE FRACTURE  POST-OPERATIVE DIAGNOSIS:  Same  PROCEDURE:  OPEN REDUCTION INTERNAL FIXATION (ORIF) RIGHT ANKLE FRACTURE  SURGEON:  Dequavius Kuhner, Ernesta Amble, MD  ASSISTANT: Roxan Hockey, PA-C, he was present and scrubbed throughout the case, critical for completion in a timely fashion, and for retraction, instrumentation, and closure.   ANESTHESIA:   block  PREOPERATIVE INDICATIONS:  [name] is a  75 y.o. female with a diagnosis of RIGHT ANKLE FRACTURE who failed conservative measures and elected for surgical management.    The risks benefits and alternatives were discussed with the patient preoperatively including but not limited to the risks of infection, bleeding, nerve injury, cardiopulmonary complications, the need for revision surgery, among others, and the patient was willing to proceed.  OPERATIVE IMPLANTS: stryker stainless ankle plate  OPERATIVE FINDINGS: Unstable ankle fracture. Stable syndesmosis post op  BLOOD LOSS: min  COMPLICATIONS: none  TOURNIQUET TIME: 40min  OPERATIVE PROCEDURE:  Patient was identified in the preoperative holding area and site was marked by me He was transported to the operating theater and placed on the table in supine position taking care to pad all bony prominences. After a preincinduction time out anesthesia was induced. The right lower extremity was prepped and draped in normal sterile fashion and a pre-incision timeout was performed. Bonnie Alvarez received ancef for preoperative antibiotics.   I made a lateral incision of roughly 7 cm dissection was carried down sharply to the distal fibula and then spreading dissection was used proximally to protect the superficial peroneal nerve. I sharply incised the periosteum and took care to protect the peroneal tendons. I then debrided the fracture site and performed a reduction maneuver which was held in  place with a clamp.   I placed a lag screw across the fracture  I then selected a 7-hole one third tubular plate and placed in a neutralization fashion care was taken distally so as not to penetrate the joint with the cancellus screws.  I then turned my attention medially where I created a 4 cm incision and dissected sharply down to the medial Mal fracture taking care to protect the saphenous vein. I debrided the fracture and reduced and held in place with a tenaculum. I then drilled and placed 2 partially threaded 45 mm cannulated screws one anterior and one posterior across the fracture.  I then stressed the syndesmosis and it was unstable, for syndesmotic fixation I performed a reduction maneuver with a clamp and placed a cancelous screw  I assesed the posterior mal piece and it was small enough to not require fixation as it involved less than 20% of the articular surface  The wound was then thoroughly irrigated and closed using a 0 Vicryl and absorbable Monocryl sutures. He was placed in a short leg splint.   POST OPERATIVE PLAN: Non-weightbearing. DVT prophylaxis will consist of mobilization and chemical px

## 2015-11-14 NOTE — Transfer of Care (Signed)
Immediate Anesthesia Transfer of Care Note  Patient: Bonnie Alvarez  Procedure(s) Performed: Procedure(s): OPEN REDUCTION INTERNAL FIXATION (ORIF) RIGHT ANKLE FRACTURE (Right)  Patient Location: PACU  Anesthesia Type:MAC combined with regional for post-op pain  Level of Consciousness: awake, alert  and oriented  Airway & Oxygen Therapy: Patient Spontanous Breathing and Patient connected to face mask oxygen  Post-op Assessment: Report given to RN, Post -op Vital signs reviewed and stable and Patient moving all extremities  Post vital signs: Reviewed and stable  Last Vitals:  Vitals:   11/14/15 0820  BP: (!) 157/71  Pulse: 78  Resp: 18  Temp: 37.1 C    Last Pain:  Vitals:   11/14/15 0820  TempSrc: Oral         Complications: No apparent anesthesia complications

## 2015-11-14 NOTE — Anesthesia Postprocedure Evaluation (Signed)
Anesthesia Post Note  Patient: Bonnie Alvarez  Procedure(s) Performed: Procedure(s) (LRB): OPEN REDUCTION INTERNAL FIXATION (ORIF) RIGHT ANKLE FRACTURE (Right)  Patient location during evaluation: PACU Anesthesia Type: MAC Level of consciousness: awake and alert Pain management: pain level controlled Vital Signs Assessment: post-procedure vital signs reviewed and stable Respiratory status: spontaneous breathing, nonlabored ventilation and respiratory function stable Cardiovascular status: stable and blood pressure returned to baseline Anesthetic complications: no    Last Vitals:  Vitals:   11/14/15 1330 11/14/15 1430  BP:    Pulse:    Resp:    Temp: 36.7 C 36.6 C    Last Pain:  Vitals:   11/14/15 1430  TempSrc:   PainSc: 0-No pain                 Nilda Simmer

## 2015-11-15 ENCOUNTER — Encounter (HOSPITAL_COMMUNITY): Payer: Self-pay | Admitting: Orthopedic Surgery

## 2015-11-15 DIAGNOSIS — S8251XA Displaced fracture of medial malleolus of right tibia, initial encounter for closed fracture: Secondary | ICD-10-CM | POA: Diagnosis not present

## 2015-11-15 NOTE — Clinical Social Work Note (Signed)
Clinical Social Work Assessment  Patient Details  Name: Bonnie Alvarez MRN: [id number] Date of Birth: [birth year]  Date of referral:  11/15/15               Reason for consult:  Facility Placement                Permission sought to share information with:   Designer, jewellery) Permission granted to share information::   Designer, jewellery)  Name::        Agency::     Relationship::     Contact Information:     Housing/Transportation Living arrangements for the past 2 months:  [place] Designer, jewellery) Source of Information:  Patient Patient Interpreter Needed:  None Criminal Activity/Legal Involvement Pertinent to Current Situation/Hospitalization:  No - Comment as needed Significant Relationships:  Adult Children Lives with:  Facility Resident Do you feel safe going back to the place where you live?  Yes Need for family participation in patient care:  Yes (Comment)  Care giving concerns:  Patient states that she needs assistance with completing ADL'S.    Social Worker assessment / plan:  SW met with patient at bedside. There was no family present during assessment. Patient was alert and oriented.  Patient states that prior to comeing to [facility] she was living at [facility] in their independent living unit. However, she states that she had a fall and broke her ankle. She says she now needs assistance with ADL's. Patient states that she is agreeable to go to another level of care. SW spoke with admissions at [facility] who states pt is welcomed to come to facility.   Employment status:  Retired Forensic scientist:   Education officer, environmental) PT Recommendations:  [place] / Referral to community resources:   (Referred back to [facility])  Patient/Family's Response to care:  Patient and family are aware that pt will go to [facility] and are accepting.   Patient/Family's Understanding of and Emotional Response to Diagnosis, Current Treatment, and Prognosis:   Patient and family have no questions for SW.  Emotional Assessment Appearance:  Appears stated age Attitude/Demeanor/Rapport:   (Appropriate) Affect (typically observed):  Accepting Orientation:  Oriented to Self, Oriented to Place, Oriented to Situation, Oriented to  Time Alcohol / Substance use:  Not Applicable Psych involvement (Current and /or in the community):  No (Comment)  Discharge Needs  Concerns to be addressed:  No discharge needs identified Readmission within the last 30 days:  No Current discharge risk:  None Barriers to Discharge:  No Barriers Identified   Bernita Buffy 11/15/2015, 3:55 PM

## 2015-11-15 NOTE — Discharge Summary (Signed)
Discharge Summary  Patient ID: Bonnie Alvarez MRN: [id number] DOB/AGE: 77-Jun-1942 77 y.o.  Admit date: 11/14/2015 Discharge date: 11/15/2015  Admission Diagnoses:  Closed right ankle fracture  Discharge Diagnoses:  Principal Problem:   Closed right ankle fracture Active Problems:   Anemia of chronic disease CKD - Cr at baseline.  Follows with specialist every 4 weeks  Past Medical History:  Diagnosis Date  . Anemia   . Chronic kidney disease    secondary hyperparathyroidism  . GERD (gastroesophageal reflux disease)   . Gout, unspecified   . Hyperlipidemia   . Hypertension   . Hypothyroid   . Monoclonal gammopathy   . Obesity   . Osteoarthritis   . Secondary hyperparathyroidism ([place])     Surgeries: Procedure(s): OPEN REDUCTION INTERNAL FIXATION (ORIF) RIGHT ANKLE FRACTURE on 11/14/2015   Consultants (if any):   Discharged Condition: Improved  Subjective: Feeling good.  OOB.  Pain controlled with IV and PO meds.  Tolerating diet.  Urinating.  No CP, SOB.  General: NAD.  Upright smiling in bed Resp: No increased WOB Cardio: regular rate and rhythm ABD soft Neurologically intact MSK Toes warm.  Splint in place.   Sensation intact distally  Weight Bearing: Non Weight Bearing (NWB) right leg Dressings: splint full time until follow up.    Hospital Course: Bonnie Alvarez is an 75 y.o. female who was admitted 11/14/2015 with a diagnosis of Closed right ankle fracture and went to the operating room on 11/14/2015 and underwent the above named procedures.    She was given perioperative antibiotics:  Anti-infectives    Start     Dose/Rate Route Frequency Ordered Stop   11/14/15 1000  ceFAZolin (ANCEF) IVPB 2g/100 mL premix     2 g 200 mL/hr over 30 Minutes Intravenous On call to O.R. 11/14/15 0803 11/14/15 1100    .  She was given sequential compression devices, early ambulation, and ASA 81mg  for 30 days for DVT prophylaxis.  She benefited maximally from the  hospital stay and there were no complications.    Recent vital signs:  Vitals:   11/14/15 2057 11/15/15 0533  BP: (!) 136/55 (!) 150/54  Pulse: 77 73  Resp: 18 20  Temp: 99.2 F (37.3 C) 98.7 F (37.1 C)    Recent laboratory studies:  Lab Results  Component Value Date   HGB 10.0 (L) 11/14/2015   HGB 10.5 (L) 10/20/2015   HGB 10.4 (L) 09/22/2015   Lab Results  Component Value Date   WBC 11.6 (H) 11/14/2015   PLT 324 11/14/2015   No results found for: INR Lab Results  Component Value Date   NA 136 11/14/2015   K 4.2 11/14/2015   CL 99 (L) 11/14/2015   CO2 25 11/14/2015   BUN 75 (H) 11/14/2015   CREATININE 4.30 (H) 11/14/2015   GLUCOSE 127 (H) 11/14/2015    Discharge Medications:     Medication List    TAKE these medications   allopurinol 100 MG tablet Commonly known as:  ZYLOPRIM Take 100 mg by mouth 2 (two) times daily.   amLODipine 10 MG tablet Commonly known as:  NORVASC Take 5 mg by mouth every evening.   aspirin EC 81 MG tablet Take 1 tablet (81 mg total) by mouth daily.   atorvastatin 20 MG tablet Commonly known as:  LIPITOR Take 20 mg by mouth every evening.   docusate sodium 100 MG capsule Commonly known as:  COLACE Take 1 capsule (100 mg total) by mouth 2 (two)  times daily. To prevent constipation while taking pain medication.   esomeprazole 40 MG capsule Commonly known as:  NEXIUM Take 40 mg by mouth daily before breakfast.   furosemide 40 MG tablet Commonly known as:  LASIX Take 80 mg by mouth daily.   hydrALAZINE 100 MG tablet Commonly known as:  APRESOLINE Take 100 mg by mouth 3 (three) times daily.   levothyroxine 75 MCG tablet Commonly known as:  SYNTHROID, LEVOTHROID Take 75 mcg by mouth daily before breakfast.   metoprolol 50 MG tablet Commonly known as:  LOPRESSOR Take 50 mg by mouth 2 (two) times daily.   traMADol 50 MG tablet Commonly known as:  ULTRAM Take 1 tablet (50 mg total) by mouth every 12 (twelve) hours as  needed for severe pain. What changed:  Another medication with the same name was added. Make sure you understand how and when to take each.   traMADol 50 MG tablet Commonly known as:  ULTRAM Take 1 tablet (50 mg total) by mouth every 6 (six) hours as needed for moderate pain. What changed:  You were already taking a medication with the same name, and this prescription was added. Make sure you understand how and when to take each.       Diagnostic Studies: Dg Ankle 2 Views Right  Result Date: 11/08/2015 CLINICAL DATA:  Fall with twisting injury while walking to the bathroom at her home. Deformity. EXAM: RIGHT ANKLE - 2 VIEW COMPARISON:  None. FINDINGS: Displaced trimalleolar fracture. Distal fibular fracture just proximal to the ankle mortise is comminuted, displaced and minimally angulated. Medial malleolus fracture primarily transverse in orientation, distal fracture fragment remains aligned with the talus. There is lateral talar subluxation and apex medial angulation. Posterior tibial tubercle fracture is comminuted. Soft tissue edema at the fracture site. IMPRESSION: Displaced trimalleolar fracture with lateral subluxation and angulation of the talus with respect to the distal tibia. Electronically Signed   By: Jeb Levering M.D.   On: 11/08/2015 04:54   Dg Ankle Right Port  Result Date: 11/08/2015 CLINICAL DATA:  Fracture, postreduction. EXAM: PORTABLE RIGHT ANKLE - 2 VIEW COMPARISON:  Pre reduction radiographs earlier this day. FINDINGS: Improved alignment of the trimalleolar fracture postreduction. There is minimal residual lateral talar subluxation and talar tilt. Overlying cast material partially obscures osseous and soft tissue fine detail. IMPRESSION: Improved alignment of trimalleolar fracture postreduction with mild residual talar subluxation and talar tilt. Electronically Signed   By: Jeb Levering M.D.   On: 11/08/2015 06:46    Disposition: 01-Home or Self Care follow up in  the office in 1-2 weeks.   Follow-up Information    MURPHY, TIMOTHY D, MD. Schedule an appointment as soon as possible for a visit in 2 week(s).   Specialty:  Orthopedic Surgery Contact information: [address] [place] [address] [phone]            Signed: Prudencio Burly III PA-C 11/15/2015, 7:09 AM

## 2015-11-15 NOTE — Clinical Social Work Placement (Signed)
   CLINICAL SOCIAL WORK PLACEMENT  NOTE  Date:  11/15/2015  Patient Details  Name: Bonnie Alvarez MRN: [id number] Date of Birth: 11-18-1940  Clinical Social Work is seeking post-discharge placement for this patient at the [place] level of care (*CSW will initial, date and re-position this form in  chart as items are completed):  Yes   Patient/family provided with [facility] Work Department's list of facilities offering this level of care within the geographic area requested by the patient (or if unable, by the patient's family).  Yes   Patient/family informed of their freedom to choose among providers that offer the needed level of care, that participate in Medicare, Medicaid or managed care program needed by the patient, have an available bed and are willing to accept the patient.  Yes   Patient/family informed of [facility]'s ownership interest in [facility] and [facility], as well as of the fact that they are under no obligation to receive care at these facilities.  PASRR submitted to EDS on       PASRR number received on       Existing PASRR number confirmed on       FL2 transmitted to all facilities in geographic area requested by pt/family on       FL2 transmitted to all facilities within larger geographic area on       Patient informed that his/her managed care company has contracts with or will negotiate with certain facilities, including the following:        Yes   Patient/family informed of bed offers received.  Patient chooses bed at  [facility])     Physician recommends and patient chooses bed at      Patient to be transferred to   on  .  Patient to be transferred to facility by       Patient family notified on   of transfer.  Name of family member notified:        PHYSICIAN       Additional Comment:    _______________________________________________ Bernita Buffy 11/15/2015, 4:02 PM

## 2015-11-15 NOTE — NC FL2 (Signed)
[place] LEVEL OF CARE SCREENING TOOL     IDENTIFICATION  Patient Name: Bonnie Alvarez Birthdate: 16-Oct-1940 Sex: female Admission Date (Current Location): 11/14/2015  [facility] and [place] Number:  Herbalist and Address:  The [place], [place] [address], [place], [place] [address]      Provider Number: [id number]  Attending Physician Name and Address:  Renette Butters, MD  Relative Name and Phone Number:       Current Level of Care: Hospital Recommended Level of Care: [place] Prior Approval Number:    Date Approved/Denied:   PASRR Number:  [id number])  Discharge Plan: SNF    Current Diagnoses: Patient Active Problem List   Diagnosis Date Noted  . Closed right ankle fracture 11/14/2015  . Anemia of chronic disease 10/12/2015    Orientation RESPIRATION BLADDER Height & Weight     Self, Time, Place, Situation  Normal Incontinent Weight: 178 lb (80.7 kg) Height:  5\' 6"  (167.6 cm)  BEHAVIORAL SYMPTOMS/MOOD NEUROLOGICAL BOWEL NUTRITION STATUS      Incontinent Diet (Normal)  AMBULATORY STATUS COMMUNICATION OF NEEDS Skin   Limited Assist Verbally Normal                       Personal Care Assistance Level of Assistance  Bathing, Dressing Bathing Assistance: Limited assistance   Dressing Assistance: Limited assistance     Functional Limitations Info             SPECIAL CARE FACTORS FREQUENCY                       Contractures      Additional Factors Info  Code Status (Full)               Current Medications (11/15/2015):  This is the current hospital active medication list Current Facility-Administered Medications  Medication Dose Route Frequency Provider Last Rate Last Dose  . 0.9 %  sodium chloride infusion   Intravenous Continuous Nilda Simmer, MD 10 mL/hr at 11/14/15 0911 10 mL/hr at 11/14/15 0911  . acetaminophen (TYLENOL) tablet 650 mg  650 mg Oral Q6H  PRN Renette Butters, MD       Or  . acetaminophen (TYLENOL) suppository 650 mg  650 mg Rectal Q6H PRN Renette Butters, MD      . allopurinol (ZYLOPRIM) tablet 100 mg  100 mg Oral BID Renette Butters, MD   100 mg at 11/15/15 [id number]  . amLODipine (NORVASC) tablet 5 mg  5 mg Oral QPM Renette Butters, MD   5 mg at 11/14/15 1614  . aspirin tablet 325 mg  325 mg Oral Daily Renette Butters, MD   325 mg at 11/15/15 [id number]  . atorvastatin (LIPITOR) tablet 20 mg  20 mg Oral QPM Renette Butters, MD   20 mg at 11/14/15 1614  . baclofen (LIORESAL) tablet 10 mg  10 mg Oral TID PRN Renette Butters, MD      . docusate sodium (COLACE) capsule 100 mg  100 mg Oral BID Renette Butters, MD   100 mg at 11/15/15 [id number]  . fentaNYL (SUBLIMAZE) injection 25-50 mcg  25-50 mcg Intravenous Q5 min PRN Nilda Simmer, MD      . furosemide (LASIX) tablet 80 mg  80 mg Oral Daily Renette Butters, MD   80 mg at 11/15/15 [id number]  . hydrALAZINE (APRESOLINE) tablet 100 mg  100 mg Oral TID Renette Butters, MD   100 mg at 11/15/15 [id number]  . levothyroxine (SYNTHROID, LEVOTHROID) tablet 75 mcg  75 mcg Oral QAC breakfast Renette Butters, MD   75 mcg at 11/15/15 [phone]  . metoCLOPramide (REGLAN) tablet 5-10 mg  5-10 mg Oral Q8H PRN Renette Butters, MD       Or  . metoCLOPramide (REGLAN) injection 5-10 mg  5-10 mg Intravenous Q8H PRN Renette Butters, MD      . metoprolol (LOPRESSOR) tablet 50 mg  50 mg Oral BID Renette Butters, MD   50 mg at 11/15/15 [id number]  . midazolam (VERSED) injection 2 mg  2 mg Intravenous Once Nilda Simmer, MD      . ondansetron [facility]) injection 4 mg  4 mg Intravenous Once PRN Nilda Simmer, MD      . ondansetron [facility]) tablet 4 mg  4 mg Oral Q6H PRN Renette Butters, MD       Or  . ondansetron [facility]) injection 4 mg  4 mg Intravenous Q6H PRN Renette Butters, MD      . pantoprazole (PROTONIX) EC tablet 40 mg  40 mg Oral Daily Renette Butters, MD   40 mg at 11/15/15 [id number]  .  senna (SENOKOT) tablet 8.6 mg  1 tablet Oral BID Renette Butters, MD   8.6 mg at 11/15/15 [id number]  . traMADol (ULTRAM) tablet 50 mg  50 mg Oral Q6H PRN Renette Butters, MD   50 mg at 11/15/15 [id number]     Discharge Medications: Please see discharge summary for a list of discharge medications.  Relevant Imaging Results:  Relevant Lab Results:   Additional Information    Venetia Maxon, [place] R

## 2015-11-15 NOTE — Progress Notes (Signed)
Report called to Lavella Lemons at [facility] [phone]. [facility] here to transport. Patient alert and oriented. No complaints of pain or discomfort. No signs of distress. Transferred to stretcher.

## 2015-11-16 ENCOUNTER — Non-Acute Institutional Stay (SKILLED_NURSING_FACILITY): Payer: Medicare Other | Admitting: Adult Health

## 2015-11-16 ENCOUNTER — Encounter: Payer: Self-pay | Admitting: Adult Health

## 2015-11-16 ENCOUNTER — Other Ambulatory Visit (HOSPITAL_COMMUNITY): Payer: Self-pay | Admitting: *Deleted

## 2015-11-16 DIAGNOSIS — E785 Hyperlipidemia, unspecified: Secondary | ICD-10-CM

## 2015-11-16 DIAGNOSIS — N185 Chronic kidney disease, stage 5: Secondary | ICD-10-CM

## 2015-11-16 DIAGNOSIS — K219 Gastro-esophageal reflux disease without esophagitis: Secondary | ICD-10-CM | POA: Diagnosis not present

## 2015-11-16 DIAGNOSIS — D638 Anemia in other chronic diseases classified elsewhere: Secondary | ICD-10-CM | POA: Diagnosis not present

## 2015-11-16 DIAGNOSIS — K5901 Slow transit constipation: Secondary | ICD-10-CM

## 2015-11-16 DIAGNOSIS — R2681 Unsteadiness on feet: Secondary | ICD-10-CM | POA: Diagnosis not present

## 2015-11-16 DIAGNOSIS — M1A9XX0 Chronic gout, unspecified, without tophus (tophi): Secondary | ICD-10-CM

## 2015-11-16 DIAGNOSIS — S82891S Other fracture of right lower leg, sequela: Secondary | ICD-10-CM | POA: Diagnosis not present

## 2015-11-16 DIAGNOSIS — I10 Essential (primary) hypertension: Secondary | ICD-10-CM | POA: Diagnosis not present

## 2015-11-16 DIAGNOSIS — E038 Other specified hypothyroidism: Secondary | ICD-10-CM | POA: Diagnosis not present

## 2015-11-16 NOTE — Progress Notes (Signed)
Patient ID: Bonnie Alvarez, female   DOB: 1940/12/02, 75 y.o.   MRN: [id number]    DATE:  11/16/2015   MRN:  [id number]  BIRTHDAY: 1940-03-22  Facility:  [facility] Home Location:  [place] Room Number: [id number]  LEVEL OF CARE:  SNF [phone])  Contact Information    Name Relation Home Work Mobile   Starling Manns   [phone]   [facility] Relative [phone]  [phone]       Code Status History    Date Active Date Inactive Code Status Order ID Comments User Context   11/14/2015  3:15 PM 11/15/2015  7:31 PM Full Code [id number]  Sindy Messing, PA-C Inpatient       Chief Complaint  Patient presents with  . Hospitalization Follow-up    HISTORY OF PRESENT ILLNESS:  This is a 75 year old female who has been re-admitted to [facility] on 11/15/15 from [facility] with closed right ankle fracture for which she had ORIF on 11/14/15.  She has been admitted for a short-term rehabilitation.  She was seen in the room today and complained of moderate right ankle pain.    PAST MEDICAL HISTORY:  Past Medical History:  Diagnosis Date  . Anemia   . Chronic kidney disease    secondary hyperparathyroidism  . GERD (gastroesophageal reflux disease)   . Gout, unspecified   . Hyperlipidemia   . Hypertension   . Hypothyroid   . Monoclonal gammopathy   . Obesity   . Osteoarthritis   . Secondary hyperparathyroidism ([place])      CURRENT MEDICATIONS: Reviewed  Patient's Medications  New Prescriptions   No medications on file  Previous Medications   ALLOPURINOL (ZYLOPRIM) 100 MG TABLET    Take 100 mg by mouth 2 (two) times daily.    AMLODIPINE (NORVASC) 5 MG TABLET    Take 5 mg by mouth daily.   ASPIRIN EC 81 MG TABLET    Take 1 tablet (81 mg total) by mouth daily.   ATORVASTATIN (LIPITOR) 20 MG TABLET    Take 20 mg by mouth every evening.    DOCUSATE SODIUM (COLACE) 100 MG CAPSULE    Take 1 capsule (100 mg total) by mouth  2 (two) times daily. To prevent constipation while taking pain medication.   FUROSEMIDE (LASIX) 80 MG TABLET    Take 80 mg by mouth daily.   HYDRALAZINE (APRESOLINE) 100 MG TABLET    Take 100 mg by mouth 3 (three) times daily.    LEVOTHYROXINE (SYNTHROID, LEVOTHROID) 75 MCG TABLET    Take 75 mcg by mouth daily before breakfast.   METOPROLOL (LOPRESSOR) 50 MG TABLET    Take 50 mg by mouth 2 (two) times daily.    OMEPRAZOLE (PRILOSEC) 20 MG CAPSULE    Take 20 mg by mouth daily.   TRAMADOL (ULTRAM) 50 MG TABLET    Take 1 tablet (50 mg total) by mouth every 6 (six) hours as needed for moderate pain.  Modified Medications   No medications on file  Discontinued Medications   AMLODIPINE (NORVASC) 10 MG TABLET    Take 5 mg by mouth every evening.    ESOMEPRAZOLE (NEXIUM) 40 MG CAPSULE    Take 40 mg by mouth daily before breakfast.   FUROSEMIDE (LASIX) 40 MG TABLET    Take 80 mg by mouth daily.    TRAMADOL (ULTRAM) 50 MG TABLET    Take 1 tablet (50 mg total) by mouth every 12 (  twelve) hours as needed for severe pain.     Allergies  Allergen Reactions  . Hydrocodone Bitart (Antituss) [Hydrocodone] Other (See Comments)    UNSPECIFIED   . Lyrica [Pregabalin] Other (See Comments)    UNSPECIFIED   . Penicillins Other (See Comments)    UNSPECIFIED  Has patient had a PCN reaction causing immediate rash, facial/tongue/throat swelling, SOB or lightheadedness with hypotension: unknown Has patient had a PCN reaction causing severe rash involving mucus membranes or skin necrosis: unknown Has patient had a PCN reaction that required hospitalization: unknown Has patient had a PCN reaction occurring within the last 10 years: no If all of the above answers are "NO", then may proceed with Cephalosporin use.      REVIEW OF SYSTEMS:  GENERAL: no change in appetite, no fatigue, no weight changes, no fever, chills or weakness EYES: Denies change in vision, dry eyes, eye pain, itching or discharge EARS:  Denies change in hearing, ringing in ears, or earache NOSE: Denies nasal congestion or epistaxis MOUTH and THROAT: Denies oral discomfort, gingival pain or bleeding, pain from teeth or hoarseness   RESPIRATORY: no cough, SOB, DOE, wheezing, hemoptysis CARDIAC: no chest pain, edema or palpitations GI: no abdominal pain, diarrhea, constipation, heart burn, nausea or vomiting GU: Denies dysuria, frequency, hematuria, incontinence, or discharge PSYCHIATRIC: Denies feeling of depression or anxiety. No report of hallucinations, insomnia, paranoia, or agitation    PHYSICAL EXAMINATION  GENERAL APPEARANCE: Well nourished. In no acute distress. Normal body habitus SKIN:  Right lower leg and foot has splint and covered with ACE wrap; she is able to wiggle toes HEAD: Normal in size and contour. No evidence of trauma EYES: Lids open and close normally. No blepharitis, entropion or ectropion. PERRL. Conjunctivae are clear and sclerae are white. Lenses are without opacity EARS: Pinnae are normal. Patient hears normal voice tunes of the examiner MOUTH and THROAT: Lips are without lesions. Oral mucosa is moist and without lesions. Tongue is normal in shape, size, and color and without lesions NECK: supple, trachea midline, no neck masses, no thyroid tenderness, no thyromegaly LYMPHATICS: no LAN in the neck, no supraclavicular LAN RESPIRATORY: breathing is even & unlabored, BS CTAB CARDIAC: RRR, no murmur,no extra heart sounds, no edema GI: abdomen soft, normal BS, no masses, no tenderness, no hepatomegaly, no splenomegaly EXTREMITIES:  Able to move BUE and LLE; has limited ROM on RLE due to surgery PSYCHIATRIC: Alert and oriented X 3. Affect and behavior are appropriate  LABS/RADIOLOGY: Labs reviewed: Basic Metabolic Panel:  Recent Labs  08/25/15 1035 09/22/15 1030 10/20/15 1030 11/14/15 0816  NA 138 139 136 136  K 3.7 4.1 3.6 4.2  CL 103 107 104 99*  CO2 23 23 23 25   GLUCOSE 107* 100* 97  127*  BUN 68* 69* 69* 75*  CREATININE 5.09* 4.23* 4.40* 4.30*  CALCIUM 10.1  9.7 10.5* 10.1 10.0  PHOS 3.6 3.6 3.6  --    Liver Function Tests:  Recent Labs  08/25/15 1035 09/22/15 1030 10/20/15 1030  ALBUMIN 3.6 3.5 3.8   CBC:  Recent Labs  09/22/15 1028 10/20/15 1023 11/14/15 0816  WBC  --   --  11.6*  HGB 10.4* 10.5* 10.0*  HCT  --   --  31.2*  MCV  --   --  92.6  PLT  --   --  324     Dg Ankle 2 Views Right  Result Date: 11/08/2015 CLINICAL DATA:  Fall with twisting injury while walking to  the bathroom at her home. Deformity. EXAM: RIGHT ANKLE - 2 VIEW COMPARISON:  None. FINDINGS: Displaced trimalleolar fracture. Distal fibular fracture just proximal to the ankle mortise is comminuted, displaced and minimally angulated. Medial malleolus fracture primarily transverse in orientation, distal fracture fragment remains aligned with the talus. There is lateral talar subluxation and apex medial angulation. Posterior tibial tubercle fracture is comminuted. Soft tissue edema at the fracture site. IMPRESSION: Displaced trimalleolar fracture with lateral subluxation and angulation of the talus with respect to the distal tibia. Electronically Signed   By: Jeb Levering M.D.   On: 11/08/2015 04:54   Dg Ankle Right Port  Result Date: 11/08/2015 CLINICAL DATA:  Fracture, postreduction. EXAM: PORTABLE RIGHT ANKLE - 2 VIEW COMPARISON:  Pre reduction radiographs earlier this day. FINDINGS: Improved alignment of the trimalleolar fracture postreduction. There is minimal residual lateral talar subluxation and talar tilt. Overlying cast material partially obscures osseous and soft tissue fine detail. IMPRESSION: Improved alignment of trimalleolar fracture postreduction with mild residual talar subluxation and talar tilt. Electronically Signed   By: Jeb Levering M.D.   On: 11/08/2015 06:46    ASSESSMENT/PLAN:  Unsteady gait - for rehabilitation, PT and OT, for therapeutic strengthening  exercises; fall precaution  Closed right ankle fracture S/P ORIF -  for rehabilitation, PT and OT, for therapeutic strengthening exercises; increase Tramadol to 50 mg 1 tab PO Q 4 hours PRN for pain; ASA 81 mg daily X 30 days for DVT prophylaxis;  follow-up with orthopedic surgeon, Dr. Edmonia Lynch, in 2 weeks  Hypertension - continue Amlodipine 5 mg 1 tab daily, Hydralazine 100 mg 1 tab PO TID, Metoprolol 50 mg BID  and Lasix 40 mg give 2 tabs = 80 mg daily; check CMP  Gout - continue Allopurinol 100 mg BID  Hyperlipidemia - continue Atorvastatin 20 mg Q evening  Constipation - continue Colace 100 mg 1 capsule BID  GERD - stable; continue Nexium 40 mg daily  Hypothyroidism - continue Levothyroxine 75 mcg daily; check tsh  Anemia of chronic disease - re-check CBC Lab Results  Component Value Date   HGB 10.0 (L) 11/14/2015   Chronic kidney disease, stage 5 - @ baseline; will monitor Lab Results  Component Value Date   CREATININE 4.30 (H) 11/14/2015         Goals of care:  Short-term rehabilitation     Durenda Age, NP [facility] [phone]

## 2015-11-17 ENCOUNTER — Inpatient Hospital Stay (HOSPITAL_COMMUNITY): Admission: RE | Admit: 2015-11-17 | Payer: Medicare Other | Source: Ambulatory Visit

## 2015-11-17 ENCOUNTER — Encounter: Payer: Self-pay | Admitting: Internal Medicine

## 2015-11-17 ENCOUNTER — Non-Acute Institutional Stay (SKILLED_NURSING_FACILITY): Payer: Medicare Other | Admitting: Internal Medicine

## 2015-11-17 DIAGNOSIS — D62 Acute posthemorrhagic anemia: Secondary | ICD-10-CM

## 2015-11-17 DIAGNOSIS — N185 Chronic kidney disease, stage 5: Secondary | ICD-10-CM | POA: Diagnosis not present

## 2015-11-17 DIAGNOSIS — S82891S Other fracture of right lower leg, sequela: Secondary | ICD-10-CM | POA: Diagnosis not present

## 2015-11-17 DIAGNOSIS — I10 Essential (primary) hypertension: Secondary | ICD-10-CM

## 2015-11-17 DIAGNOSIS — R2681 Unsteadiness on feet: Secondary | ICD-10-CM | POA: Diagnosis not present

## 2015-11-17 DIAGNOSIS — K219 Gastro-esophageal reflux disease without esophagitis: Secondary | ICD-10-CM | POA: Diagnosis not present

## 2015-11-17 DIAGNOSIS — D72829 Elevated white blood cell count, unspecified: Secondary | ICD-10-CM | POA: Diagnosis not present

## 2015-11-17 DIAGNOSIS — E038 Other specified hypothyroidism: Secondary | ICD-10-CM

## 2015-11-17 NOTE — Progress Notes (Signed)
LOCATION: [facility]  PCP: Jilda Panda, MD   Code Status: Full Code  Goals of care: Advanced Directive information Advanced Directives 11/14/2015  Does patient have an advance directive? No  Would patient like information on creating an advanced directive? -       Extended Emergency Contact Information Primary Emergency Contact: McCollum,James W Address: [address]          [place], [place] [address] [place] Mobile Phone: [phone] Relation: Son Secondary Emergency Contact: [facility] Address: [address]          [place], [place] [address] [place]nette Litter of [place] Phone: [phone] Mobile Phone: [phone] Relation: Relative   Allergies  Allergen Reactions  . Hydrocodone Bitart (Antituss) [Hydrocodone] Other (See Comments)    UNSPECIFIED   . Lyrica [Pregabalin] Other (See Comments)    UNSPECIFIED   . Penicillins Other (See Comments)    UNSPECIFIED  Has patient had a PCN reaction causing immediate rash, facial/tongue/throat swelling, SOB or lightheadedness with hypotension: unknown Has patient had a PCN reaction causing severe rash involving mucus membranes or skin necrosis: unknown Has patient had a PCN reaction that required hospitalization: unknown Has patient had a PCN reaction occurring within the last 10 years: no If all of the above answers are "NO", then may proceed with Cephalosporin use.     Chief Complaint  Patient presents with  . Readmit To SNF    Readmission     HPI:  Patient is a 75 y.o. female seen today for short term rehabilitation post hospital re-admission from 11/14/15-11/15/15 with closed right ankle fracture status post ORIF on 11/14/15. She was here undergoing rehabilitation while awaiting surgical repair. She is seen in her room today. Pain medication has been helpful.    Review of Systems:  Constitutional: Negative for fever, chills. Energy level is slowly coming back. HENT: Negative for  headache, congestion, nasal discharge Eyes: Negative for blurred vision, double vision and discharge. Wears glasses.  Respiratory: Negative for cough, shortness of breath and wheezing.   Cardiovascular: Negative for chest pain, palpitations, leg swelling.  Gastrointestinal: Negative for heartburn, nausea, vomiting, abdominal pain. Last bowel movement was 2 days ago. Genitourinary: Negative for dysuria and flank pain.  Musculoskeletal: Negative for back pain, fall in the facility.  Skin: Negative for itching, rash.  Neurological: Negative for dizziness. Psychiatric/Behavioral: Negative for depression    Past Medical History:  Diagnosis Date  . Anemia   . Chronic kidney disease    secondary hyperparathyroidism  . GERD (gastroesophageal reflux disease)   . Gout, unspecified   . Hyperlipidemia   . Hypertension   . Hypothyroid   . Monoclonal gammopathy   . Obesity   . Osteoarthritis   . Secondary hyperparathyroidism [facility])    Past Surgical History:  Procedure Laterality Date  . BACK SURGERY    . COLONOSCOPY W/ POLYPECTOMY    . ORIF ANKLE FRACTURE Right 11/14/2015   Procedure: OPEN REDUCTION INTERNAL FIXATION (ORIF) RIGHT ANKLE FRACTURE;  Surgeon: Renette Butters, MD;  Location: [place];  Service: Orthopedics;  Laterality: Right;   Social History:   reports that she has never smoked. She has never used smokeless tobacco. She reports that she does not drink alcohol or use drugs.  No family history on file.  Medications:   Medication List       Accurate as of 11/17/15  2:46 PM. Always use your most recent med list.  acetaminophen 325 MG tablet Commonly known as:  TYLENOL Take 650 mg by mouth every 4 (four) hours as needed for mild pain.   allopurinol 100 MG tablet Commonly known as:  ZYLOPRIM Take 100 mg by mouth 2 (two) times daily.   amLODipine 5 MG tablet Commonly known as:  NORVASC Take 5 mg by mouth daily.   aspirin EC 81 MG tablet Take 1 tablet (81 mg  total) by mouth daily.   atorvastatin 20 MG tablet Commonly known as:  LIPITOR Take 20 mg by mouth every evening.   docusate sodium 100 MG capsule Commonly known as:  COLACE Take 1 capsule (100 mg total) by mouth 2 (two) times daily. To prevent constipation while taking pain medication.   furosemide 80 MG tablet Commonly known as:  LASIX Take 80 mg by mouth daily.   hydrALAZINE 100 MG tablet Commonly known as:  APRESOLINE Take 100 mg by mouth 3 (three) times daily.   levothyroxine 75 MCG tablet Commonly known as:  SYNTHROID, LEVOTHROID Take 75 mcg by mouth daily before breakfast.   magnesium hydroxide 800 MG/5ML suspension Commonly known as:  MILK OF MAGNESIA Take 45 mLs by mouth daily as needed for constipation (Stop date 11/18/15).   metoprolol 50 MG tablet Commonly known as:  LOPRESSOR Take 50 mg by mouth 2 (two) times daily.   omeprazole 20 MG capsule Commonly known as:  PRILOSEC Take 20 mg by mouth daily.   traMADol 50 MG tablet Commonly known as:  ULTRAM Take 1 tablet (50 mg total) by mouth every 6 (six) hours as needed for moderate pain.       Immunizations: Immunization History  Administered Date(s) Administered  . Tdap 11/08/2015     Physical Exam:  Vitals:   11/17/15 1440  BP: (!) 141/71  Pulse: 79  Resp: 18  Temp: 100.2 F (37.9 C)  TempSrc: Oral  SpO2: 97%  Weight: 135 lb (61.2 kg)  Height: 5\' 3"  (1.6 m)   Body mass index is 23.91 kg/m.  General- elderly female, overweight, in no acute distress Head- normocephalic, atraumatic Nose- no maxillary or frontal sinus tenderness, no nasal discharge Throat- moist mucus membrane Eyes- PERRLA, EOMI, no pallor, no icterus, no discharge, normal conjunctiva, normal sclera Neck- no cervical lymphadenopathy Cardiovascular- normal s1,s2, no murmur Respiratory- bilateral clear to auscultation, no wheeze, no rhonchi, no crackles, no use of accessory muscles Abdomen- bowel sounds present, soft, non  tender Musculoskeletal- able to move all 4 extremities, right foot in cast, able to move her toes and has good capillary refill Neurological- alert and oriented to person, place and time Skin- warm and dry Psychiatry- normal mood and affect     Labs reviewed: Basic Metabolic Panel:  Recent Labs  08/25/15 1035 09/22/15 1030 10/20/15 1030 11/14/15 0816  NA 138 139 136 136  K 3.7 4.1 3.6 4.2  CL 103 107 104 99*  CO2 23 23 23 25   GLUCOSE 107* 100* 97 127*  BUN 68* 69* 69* 75*  CREATININE 5.09* 4.23* 4.40* 4.30*  CALCIUM 10.1  9.7 10.5* 10.1 10.0  PHOS 3.6 3.6 3.6  --    Liver Function Tests:  Recent Labs  08/25/15 1035 09/22/15 1030 10/20/15 1030  ALBUMIN 3.6 3.5 3.8   No results for input(s): LIPASE, AMYLASE in the last 8760 hours. No results for input(s): AMMONIA in the last 8760 hours. CBC:  Recent Labs  09/22/15 1028 10/20/15 1023 11/14/15 0816  WBC  --   --  11.6*  HGB 10.4* 10.5* 10.0*  HCT  --   --  31.2*  MCV  --   --  92.6  PLT  --   --  324   Cardiac Enzymes: No results for input(s): CKTOTAL, CKMB, CKMBINDEX, TROPONINI in the last 8760 hours. BNP: Invalid input(s): POCBNP CBG: No results for input(s): GLUCAP in the last 8760 hours.  Radiological Exams: Dg Ankle 2 Views Right  Result Date: 11/08/2015 CLINICAL DATA:  Fall with twisting injury while walking to the bathroom at her home. Deformity. EXAM: RIGHT ANKLE - 2 VIEW COMPARISON:  None. FINDINGS: Displaced trimalleolar fracture. Distal fibular fracture just proximal to the ankle mortise is comminuted, displaced and minimally angulated. Medial malleolus fracture primarily transverse in orientation, distal fracture fragment remains aligned with the talus. There is lateral talar subluxation and apex medial angulation. Posterior tibial tubercle fracture is comminuted. Soft tissue edema at the fracture site. IMPRESSION: Displaced trimalleolar fracture with lateral subluxation and angulation of the  talus with respect to the distal tibia. Electronically Signed   By: Jeb Levering M.D.   On: 11/08/2015 04:54   Dg Ankle Right Port  Result Date: 11/08/2015 CLINICAL DATA:  Fracture, postreduction. EXAM: PORTABLE RIGHT ANKLE - 2 VIEW COMPARISON:  Pre reduction radiographs earlier this day. FINDINGS: Improved alignment of the trimalleolar fracture postreduction. There is minimal residual lateral talar subluxation and talar tilt. Overlying cast material partially obscures osseous and soft tissue fine detail. IMPRESSION: Improved alignment of trimalleolar fracture postreduction with mild residual talar subluxation and talar tilt. Electronically Signed   By: Jeb Levering M.D.   On: 11/08/2015 06:46    Assessment/Plan  Gait instability Will have patient work with PT/OT as tolerated to regain strength and restore function.  Fall precautions are in place.  Right trimalleolar fracture S/p ORIF on 11/11/15. Continue tramadol 50 mg q4h prn pain. NWB to RLE. Will have her work with physical therapy and occupational therapy team to help with gait training and muscle strengthening exercises.fall precautions. Skin care. Encourage to be out of bed. Has orthopedic follow up. Continue baby aspirin for DVT prophylaxis.   Blood loss anemia Post op, monitor cbc  Leukocytosis Afebrile, likely reactive, monitor wbc curve  ckd stage 5 Monitor bmp  HTN Monitor bp, continue amlodipine 5 mg daily, hydralazine 100 mg tid with lopressor 50 mg bid and lasix 80 mg daily, check bmp  Hypothyroidism Continue synthroid 75 mcg daily  gerd Stable, continue nexium 40 mg daily    Goals of care: short term rehabilitation   Labs/tests ordered: cbc, cmp  Family/ staff Communication: reviewed care plan with patient and nursing supervisor    Blanchie Serve, MD Internal Medicine [place], [place] [address] Cell Phone (Monday-Friday 8 am - 5 pm):  [phone] On Call: [phone] and follow prompts after 5 pm and on weekends Office Phone: [phone] Office Fax: [phone]

## 2015-11-30 ENCOUNTER — Inpatient Hospital Stay (HOSPITAL_COMMUNITY): Admission: RE | Admit: 2015-11-30 | Payer: Medicare Other | Source: Ambulatory Visit

## 2015-12-01 LAB — BASIC METABOLIC PANEL
BUN: 76 mg/dL — AB (ref 4–21)
CREATININE: 4.7 mg/dL — AB (ref 0.5–1.1)
GLUCOSE: 94 mg/dL
Potassium: 4.7 mmol/L (ref 3.4–5.3)
Sodium: 140 mmol/L (ref 137–147)

## 2015-12-04 LAB — LIPID PANEL
CHOLESTEROL: 155 mg/dL (ref 0–200)
HDL: 39 mg/dL (ref 35–70)
LDL Cholesterol: 84 mg/dL
TRIGLYCERIDES: 157 mg/dL (ref 40–160)

## 2015-12-04 LAB — HEPATIC FUNCTION PANEL
ALT: 8 U/L (ref 7–35)
AST: 12 U/L — AB (ref 13–35)
Alkaline Phosphatase: 64 U/L (ref 25–125)
BILIRUBIN DIRECT: 0.04 mg/dL (ref 0.01–0.4)
BILIRUBIN, TOTAL: 0.3 mg/dL

## 2015-12-07 LAB — CBC AND DIFFERENTIAL
HCT: 27 % — AB (ref 36–46)
Hemoglobin: 8.3 g/dL — AB (ref 12.0–16.0)
NEUTROS ABS: 4 /uL
Platelets: 303 10*3/uL (ref 150–399)
WBC: 8.6 10*3/mL

## 2015-12-22 ENCOUNTER — Non-Acute Institutional Stay (SKILLED_NURSING_FACILITY): Payer: Medicare Other | Admitting: Adult Health

## 2015-12-22 ENCOUNTER — Encounter: Payer: Self-pay | Admitting: Adult Health

## 2015-12-22 DIAGNOSIS — K219 Gastro-esophageal reflux disease without esophagitis: Secondary | ICD-10-CM

## 2015-12-22 DIAGNOSIS — I10 Essential (primary) hypertension: Secondary | ICD-10-CM

## 2015-12-22 DIAGNOSIS — E038 Other specified hypothyroidism: Secondary | ICD-10-CM

## 2015-12-22 DIAGNOSIS — K5901 Slow transit constipation: Secondary | ICD-10-CM | POA: Diagnosis not present

## 2015-12-22 DIAGNOSIS — D638 Anemia in other chronic diseases classified elsewhere: Secondary | ICD-10-CM

## 2015-12-22 DIAGNOSIS — E441 Mild protein-calorie malnutrition: Secondary | ICD-10-CM | POA: Diagnosis not present

## 2015-12-22 DIAGNOSIS — E785 Hyperlipidemia, unspecified: Secondary | ICD-10-CM

## 2015-12-22 DIAGNOSIS — M1A9XX0 Chronic gout, unspecified, without tophus (tophi): Secondary | ICD-10-CM | POA: Diagnosis not present

## 2015-12-22 DIAGNOSIS — N185 Chronic kidney disease, stage 5: Secondary | ICD-10-CM

## 2015-12-22 DIAGNOSIS — S82891S Other fracture of right lower leg, sequela: Secondary | ICD-10-CM | POA: Diagnosis not present

## 2015-12-22 NOTE — Progress Notes (Addendum)
Patient ID: Bonnie Alvarez, female   DOB: 06-05-1940, 75 y.o.   MRN: [id number]    DATE:    12/22/15  MRN:  [id number]  BIRTHDAY: July 09, 1940  Facility:  [facility] Home Location:  [facility] Room Number: [id number]  LEVEL OF CARE:  [phone])  Contact Information    Name Relation Home Work Mobile   Starling Manns   [phone]   [facility] Relative [phone]  [phone]       Code Status History    Date Active Date Inactive Code Status Order ID Comments User Context   11/14/2015  3:15 PM 11/15/2015  7:31 PM Full Code [id number]  Sindy Messing, PA-C Inpatient       Chief Complaint  Patient presents with  . Medical Management of Chronic Issues    HISTORY OF PRESENT ILLNESS:  This is a 75 year old female who is being seen for a routine visit. PT and OT services were recently discontinued. She is currently NWB on RLE.  Procel supplementation was recently started. She was given Kayexalate PO X 1 due to hyperkalemia. Latest K 4.7 . Aranesp injection was recently started and latest hgb is 8.3.   She has been re-admitted to [facility] on 11/15/15 from [facility] with closed right ankle fracture for which she had ORIF on 11/14/15.  She was seen in her room today and complained of constipation.  PAST MEDICAL HISTORY:  Past Medical History:  Diagnosis Date  . Anemia   . Chronic kidney disease    secondary hyperparathyroidism  . GERD (gastroesophageal reflux disease)   . Gout, unspecified   . Hyperlipidemia   . Hypertension   . Hypothyroid   . Monoclonal gammopathy   . Obesity   . Osteoarthritis   . Secondary hyperparathyroidism ([place])      CURRENT MEDICATIONS: Reviewed  Patient's Medications  New Prescriptions   No medications on file  Previous Medications   ACETAMINOPHEN (TYLENOL) 325 MG TABLET    Take 650 mg by mouth every 4 (four) hours as needed for mild pain.   ALLOPURINOL (ZYLOPRIM) 100 MG TABLET     Take 100 mg by mouth 2 (two) times daily.    AMLODIPINE (NORVASC) 5 MG TABLET    Take 5 mg by mouth every evening.    ATORVASTATIN (LIPITOR) 20 MG TABLET    Take 20 mg by mouth every evening.    BISACODYL (DULCOLAX) 10 MG SUPPOSITORY    Place 10 mg rectally daily as needed for moderate constipation.   CLONIDINE (CATAPRES) 0.1 MG TABLET    Take 0.1 mg by mouth as needed. Give if SBP >180 and/or DBP >110 on ESA injection daily only.  If BP within parameters after 30 minutes, give ESA injection.  If BP not within parameters notify MD at Fax [phone]   DARBEPOETIN ALFA (ARANESP) 200 MCG/0.4ML SOSY INJECTION    Inject 200 mcg into the skin every 28 (twenty-eight) days. Per [facility] last dose 10/17/15 (MD aware)   FUROSEMIDE (LASIX) 80 MG TABLET    Take 80 mg by mouth daily.   HYDRALAZINE (APRESOLINE) 100 MG TABLET    Take 100 mg by mouth 3 (three) times daily.    LEVOTHYROXINE (SYNTHROID, LEVOTHROID) 75 MCG TABLET    Take 75 mcg by mouth daily before breakfast.   METOPROLOL (LOPRESSOR) 50 MG TABLET    Take 50 mg by mouth 2 (two) times daily.    OMEPRAZOLE (PRILOSEC) 20 MG CAPSULE  Take 20 mg by mouth daily.   POLYETHYLENE GLYCOL (MIRALAX / GLYCOLAX) PACKET    Take 17 g by mouth 2 (two) times daily.   PROTEIN (PROCEL) POWD    Take 2 scoop by mouth 2 (two) times daily.   SENNOSIDES-DOCUSATE SODIUM (SENOKOT-S) 8.6-50 MG TABLET    Take 2 tablets by mouth 2 (two) times daily.   TRAMADOL (ULTRAM) 50 MG TABLET    Take 50 mg by mouth every 4 (four) hours as needed.  Modified Medications   No medications on file  Discontinued Medications   ASPIRIN EC 81 MG TABLET    Take 1 tablet (81 mg total) by mouth daily.   DOCUSATE SODIUM (COLACE) 100 MG CAPSULE    Take 1 capsule (100 mg total) by mouth 2 (two) times daily. To prevent constipation while taking pain medication.   MAGNESIUM HYDROXIDE (MILK OF MAGNESIA) 800 MG/5ML SUSPENSION    Take 45 mLs by mouth daily as needed for constipation (Stop date 11/18/15).    TRAMADOL (ULTRAM) 50 MG TABLET    Take 1 tablet (50 mg total) by mouth every 6 (six) hours as needed for moderate pain.     Allergies  Allergen Reactions  . Hydrocodone Bitart (Antituss) [Hydrocodone] Other (See Comments)    UNSPECIFIED   . Lyrica [Pregabalin] Other (See Comments)    UNSPECIFIED   . Penicillins Other (See Comments)    UNSPECIFIED  Has patient had a PCN reaction causing immediate rash, facial/tongue/throat swelling, SOB or lightheadedness with hypotension: unknown Has patient had a PCN reaction causing severe rash involving mucus membranes or skin necrosis: unknown Has patient had a PCN reaction that required hospitalization: unknown Has patient had a PCN reaction occurring within the last 10 years: no If all of the above answers are "NO", then may proceed with Cephalosporin use.      REVIEW OF SYSTEMS:  GENERAL: no change in appetite, no fatigue, no weight changes, no fever, chills or weakness EYES: Denies change in vision, dry eyes, eye pain, itching or discharge EARS: Denies change in hearing, ringing in ears, or earache NOSE: Denies nasal congestion or epistaxis MOUTH and THROAT: Denies oral discomfort, gingival pain or bleeding, pain from teeth or hoarseness   RESPIRATORY: no cough, SOB, DOE, wheezing, hemoptysis CARDIAC: no chest pain, edema or palpitations GI: no abdominal pain, diarrhea, heart burn, nausea or vomiting, +constipation GU: Denies dysuria, frequency, hematuria, incontinence, or discharge PSYCHIATRIC: Denies feeling of depression or anxiety. No report of hallucinations, insomnia, paranoia, or agitation    PHYSICAL EXAMINATION  GENERAL APPEARANCE: Well nourished. In no acute distress. Normal body habitus SKIN:  Right lower leg and foot has splint and covered with ACE wrap; she is able to wiggle toes HEAD: Normal in size and contour. No evidence of trauma EYES: Lids open and close normally. No blepharitis, entropion or ectropion. PERRL.  Conjunctivae are clear and sclerae are white. Lenses are without opacity EARS: Pinnae are normal. Patient hears normal voice tunes of the examiner MOUTH and THROAT: Lips are without lesions. Oral mucosa is moist and without lesions. Tongue is normal in shape, size, and color and without lesions NECK: supple, trachea midline, no neck masses, no thyroid tenderness, no thyromegaly LYMPHATICS: no LAN in the neck, no supraclavicular LAN RESPIRATORY: breathing is even & unlabored, BS CTAB CARDIAC: RRR, no murmur,no extra heart sounds, no edema GI: abdomen soft, normal BS, no masses, no tenderness, no hepatomegaly, no splenomegaly EXTREMITIES:  Able to move BUE and LLE; has right  foot splint PSYCHIATRIC: Alert and oriented X 3. Affect and behavior are appropriate  LABS/RADIOLOGY: Labs reviewed: Basic Metabolic Panel:  Recent Labs  08/25/15 1035 09/22/15 1030 10/20/15 1030 11/14/15 0816 12/01/15    NA 138 139 136 136 140    K 3.7 4.1 3.6 4.2 4.7    CL 103 107 104 99*  --     CO2 23 23 23 25   --     GLUCOSE 107* 100* 97 127*  --     BUN 68* 69* 69* 75* 76*    CREATININE 5.09* 4.23* 4.40* 4.30* 4.7*    CALCIUM 10.1  9.7 10.5* 10.1 10.0  --     PHOS 3.6 3.6 3.6  --   --      Liver Function Tests:  Recent Labs  08/25/15 1035 09/22/15 1030 10/20/15 1030 12/04/15  AST  --   --   --  12*  ALT  --   --   --  8  ALKPHOS  --   --   --  64  ALBUMIN 3.6 3.5 3.8  --    CBC:  Recent Labs  11/14/15 0816 12/07/15   WBC 11.6* 8.6   NEUTROABS  --  4   HGB 10.0* 8.3*   HCT 31.2* 27*   MCV 92.6  --    PLT 324 303      ASSESSMENT/PLAN:  Closed right ankle fracture S/P ORIF -  PT and OT services are currently discontinued, RLE NWB; increase Tramadol to 50 mg 1 tab PO Q 4 hours PRN for pain;  follow-up with orthopedic surgeon, Dr. Edmonia Lynch  Hypertension - continue Amlodipine 5 mg 1 tab daily, Hydralazine 100 mg 1 tab PO TID, Metoprolol 50 mg BID, clonidine 0.1 mg 1 tab by mouth  daily when necessary  and Lasix 40 mg give 2 tabs = 80 mg daily  Hyperlipidemia - continue Atorvastatin 20 mg Q evening  GERD - stable; continue Omeprazole 20 mg 1 capsule by mouth daily   Hypothyroidism - continue Levothyroxine 75 mcg daily 11/17/15  tsh 4.474  Anemia of chronic disease - hgb  8.3; recently started on Aranesp 200 g/0.4 mL subcutaneous monthly monthly; check CBC 12/25/15 Chronic kidney disease, stage 5 - follows-up with nephrologyMI: Check BMP on 12/25/15 12/01/15  Creatinine 4.7  Constipation - discontinue Colace ; Start senna S 2 tabs by mouth twice a day, MiraLAX 17 g by mouth twice a day and Dulcolax suppository 10 mg 1 rectally daily when necessary   Gout - continue allopurinol 100 mg 1 tab by mouth twice a day  Protein-calorie malnutrition - albumin 3.2 ; recently started on Procel 2 scoops PO BID    Goals of care:  Short-term care     Durenda Age, NP [place] [phone]

## 2015-12-25 LAB — CBC AND DIFFERENTIAL
HCT: 27 % — AB (ref 36–46)
Hemoglobin: 8.5 g/dL — AB (ref 12.0–16.0)
Neutrophils Absolute: 4 /uL
Platelets: 283 10*3/uL (ref 150–399)
WBC: 8.5 10*3/mL

## 2015-12-25 LAB — BASIC METABOLIC PANEL
BUN: 98 mg/dL — AB (ref 4–21)
CREATININE: 4.3 mg/dL — AB (ref 0.5–1.1)
GLUCOSE: 82 mg/dL
POTASSIUM: 4.7 mmol/L (ref 3.4–5.3)
Sodium: 139 mmol/L (ref 137–147)

## 2015-12-27 LAB — BASIC METABOLIC PANEL
BUN: 93 mg/dL — AB (ref 4–21)
CREATININE: 4.3 mg/dL — AB (ref 0.5–1.1)
Glucose: 85 mg/dL
Potassium: 4.6 mmol/L (ref 3.4–5.3)
Sodium: 141 mmol/L (ref 137–147)

## 2016-01-19 ENCOUNTER — Non-Acute Institutional Stay (SKILLED_NURSING_FACILITY): Payer: Medicare Other | Admitting: Adult Health

## 2016-01-19 ENCOUNTER — Encounter: Payer: Self-pay | Admitting: Adult Health

## 2016-01-19 DIAGNOSIS — D638 Anemia in other chronic diseases classified elsewhere: Secondary | ICD-10-CM | POA: Diagnosis not present

## 2016-01-19 DIAGNOSIS — K219 Gastro-esophageal reflux disease without esophagitis: Secondary | ICD-10-CM | POA: Diagnosis not present

## 2016-01-19 DIAGNOSIS — E441 Mild protein-calorie malnutrition: Secondary | ICD-10-CM

## 2016-01-19 DIAGNOSIS — S82891S Other fracture of right lower leg, sequela: Secondary | ICD-10-CM | POA: Diagnosis not present

## 2016-01-19 DIAGNOSIS — I10 Essential (primary) hypertension: Secondary | ICD-10-CM | POA: Diagnosis not present

## 2016-01-19 DIAGNOSIS — K5901 Slow transit constipation: Secondary | ICD-10-CM

## 2016-01-19 DIAGNOSIS — N185 Chronic kidney disease, stage 5: Secondary | ICD-10-CM

## 2016-01-19 DIAGNOSIS — E785 Hyperlipidemia, unspecified: Secondary | ICD-10-CM | POA: Diagnosis not present

## 2016-01-19 DIAGNOSIS — E038 Other specified hypothyroidism: Secondary | ICD-10-CM | POA: Diagnosis not present

## 2016-01-19 DIAGNOSIS — M1A9XX0 Chronic gout, unspecified, without tophus (tophi): Secondary | ICD-10-CM | POA: Diagnosis not present

## 2016-01-19 NOTE — Progress Notes (Signed)
Patient ID: Bonnie Alvarez, female   DOB: 1940/08/16, 75 y.o.   MRN: [id number]    DATE:    01/19/16  MRN:  [id number]  BIRTHDAY: Oct 27, 1940  Facility:  Nursing Home Location:  [place] Room Number: 1004-A  LEVEL OF CARE:  SNF [phone])  Contact Information    Name Relation Home Work [place] W Son   [phone]   [facility] Relative [phone]  [phone]       Code Status History    Date Active Date Inactive Code Status Order ID Comments User Context   11/14/2015  3:15 PM 11/15/2015  7:31 PM Full Code [id number]  Sindy Messing, PA-C Inpatient       Chief Complaint  Patient presents with  . Medical Management of Chronic Issues    HISTORY OF PRESENT ILLNESS:  This is a 75 year old female who is being seen for a routine visit . She is currently NWB on RLE. She will follow-up with orthopedic surgeon this week.  She has been re-admitted to [facility] on 11/15/15 from [facility] with closed right ankle fracture for which she had ORIF on 11/14/15.   PAST MEDICAL HISTORY:  Past Medical History:  Diagnosis Date  . Anemia   . Chronic kidney disease    secondary hyperparathyroidism  . GERD (gastroesophageal reflux disease)   . Gout, unspecified   . Hyperlipidemia   . Hypertension   . Hypothyroid   . Monoclonal gammopathy   . Obesity   . Osteoarthritis   . Secondary hyperparathyroidism ([place])      CURRENT MEDICATIONS: Reviewed  Patient's Medications  New Prescriptions   No medications on file  Previous Medications   ACETAMINOPHEN (TYLENOL) 325 MG TABLET    Take 650 mg by mouth every 4 (four) hours as needed for mild pain.   ALLOPURINOL (ZYLOPRIM) 100 MG TABLET    Take 100 mg by mouth 2 (two) times daily.    AMLODIPINE (NORVASC) 5 MG TABLET    Take 5 mg by mouth every evening.    ATORVASTATIN (LIPITOR) 20 MG TABLET    Take 20 mg by mouth every evening.    BISACODYL (DULCOLAX) 10 MG  SUPPOSITORY    Place 10 mg rectally daily as needed for moderate constipation.   CLONIDINE (CATAPRES) 0.1 MG TABLET    Take 0.1 mg by mouth as needed. Give if SBP >180 and/or DBP >110 on ESA injection daily only.  If BP within parameters after 30 minutes, give ESA injection.  If BP not within parameters notify MD at Fax [phone]   DARBEPOETIN ALFA (ARANESP) 200 MCG/0.4ML SOSY INJECTION    Inject 200 mcg into the skin every 28 (twenty-eight) days. Per [facility] last dose 10/17/15 (MD aware)   FUROSEMIDE (LASIX) 80 MG TABLET    Take 80 mg by mouth daily.   HYDRALAZINE (APRESOLINE) 100 MG TABLET    Take 100 mg by mouth 3 (three) times daily.    LEVOTHYROXINE (SYNTHROID, LEVOTHROID) 75 MCG TABLET    Take 75 mcg by mouth daily before breakfast.   METOPROLOL (LOPRESSOR) 50 MG TABLET    Take 50 mg by mouth 2 (two) times daily.    OMEPRAZOLE (PRILOSEC) 20 MG CAPSULE    Take 20 mg by mouth daily.   POLYETHYLENE GLYCOL (MIRALAX / GLYCOLAX) PACKET    Take 17 g by mouth 2 (two) times daily.   PROTEIN (PROCEL) POWD    Take 2 scoop  by mouth 2 (two) times daily.   SENNOSIDES-DOCUSATE SODIUM (SENOKOT-S) 8.6-50 MG TABLET    Take 2 tablets by mouth 2 (two) times daily.   TRAMADOL (ULTRAM) 50 MG TABLET    Take 50 mg by mouth every 4 (four) hours as needed.  Modified Medications   No medications on file  Discontinued Medications   DOCUSATE SODIUM (COLACE) 100 MG CAPSULE    Take 1 capsule (100 mg total) by mouth 2 (two) times daily. To prevent constipation while taking pain medication.     Allergies  Allergen Reactions  . Hydrocodone Bitart (Antituss) [Hydrocodone] Other (See Comments)    UNSPECIFIED   . Lyrica [Pregabalin] Other (See Comments)    UNSPECIFIED   . Penicillins Other (See Comments)    UNSPECIFIED  Has patient had a PCN reaction causing immediate rash, facial/tongue/throat swelling, SOB or lightheadedness with hypotension: unknown Has patient had a PCN reaction causing severe rash involving  mucus membranes or skin necrosis: unknown Has patient had a PCN reaction that required hospitalization: unknown Has patient had a PCN reaction occurring within the last 10 years: no If all of the above answers are "NO", then may proceed with Cephalosporin use.      REVIEW OF SYSTEMS:  GENERAL: no change in appetite, no fatigue, no weight changes, no fever, chills or weakness EYES: Denies change in vision, dry eyes, eye pain, itching or discharge EARS: Denies change in hearing, ringing in ears, or earache NOSE: Denies nasal congestion or epistaxis MOUTH and THROAT: Denies oral discomfort, gingival pain or bleeding, pain from teeth or hoarseness   RESPIRATORY: no cough, SOB, DOE, wheezing, hemoptysis CARDIAC: no chest pain, edema or palpitations GI: no abdominal pain, diarrhea, heart burn, nausea or vomiting, +constipation GU: Denies dysuria, frequency, hematuria, incontinence, or discharge PSYCHIATRIC: Denies feeling of depression or anxiety. No report of hallucinations, insomnia, paranoia, or agitation    PHYSICAL EXAMINATION  GENERAL APPEARANCE: Well nourished. In no acute distress. Normal body habitus SKIN:  Right medial ankle wound  HEAD: Normal in size and contour. No evidence of trauma EYES: Lids open and close normally. No blepharitis, entropion or ectropion. PERRL. Conjunctivae are clear and sclerae are white. Lenses are without opacity EARS: Pinnae are normal. Patient hears normal voice tunes of the examiner MOUTH and THROAT: Lips are without lesions. Oral mucosa is moist and without lesions. Tongue is normal in shape, size, and color and without lesions NECK: supple, trachea midline, no neck masses, no thyroid tenderness, no thyromegaly LYMPHATICS: no LAN in the neck, no supraclavicular LAN RESPIRATORY: breathing is even & unlabored, BS CTAB CARDIAC: RRR, no murmur,no extra heart sounds, no edema GI: abdomen soft, normal BS, no masses, no tenderness, no hepatomegaly, no  splenomegaly EXTREMITIES:  Able to move X 4 extremities; splint on RLE PSYCHIATRIC: Alert and oriented X 3. Affect and behavior are appropriate  LABS/RADIOLOGY: Labs reviewed: Basic Metabolic Panel:  Recent Labs  08/25/15 1035 09/22/15 1030 10/20/15 1030 11/14/15 0816 12/01/15 12/25/15 12/27/15  NA 138 139 136 136 140 139 141  K 3.7 4.1 3.6 4.2 4.7 4.7 4.6  CL 103 107 104 99*  --   --   --   CO2 23 23 23 25   --   --   --   GLUCOSE 107* 100* 97 127*  --   --   --   BUN 68* 69* 69* 75* 76* 98* 93*  CREATININE 5.09* 4.23* 4.40* 4.30* 4.7* 4.3* 4.3*  CALCIUM 10.1  9.7 10.5* 10.1  10.0  --   --   --   PHOS 3.6 3.6 3.6  --   --   --   --    Liver Function Tests:  Recent Labs  08/25/15 1035 09/22/15 1030 10/20/15 1030 12/04/15  AST  --   --   --  12*  ALT  --   --   --  8  ALKPHOS  --   --   --  64  ALBUMIN 3.6 3.5 3.8  --    CBC:  Recent Labs  11/14/15 0816 12/07/15 12/25/15  WBC 11.6* 8.6 8.5  NEUTROABS  --  4 4  HGB 10.0* 8.3* 8.5*  HCT 31.2* 27* 27*  MCV 92.6  --   --   PLT 324 303 283   Lipid Panel:  Recent Labs  12/04/15  HDL 39      ASSESSMENT/PLAN:  Closed right ankle fracture S/P ORIF -  PT and OT services are currently discontinued, RLE NWB; increase Tramadol to 50 mg 1 tab PO Q 4 hours PRN for pain;  follow-up with orthopedic surgeon, Dr. Edmonia Lynch  Hypertension - continue Amlodipine 5 mg 1 tab daily, Hydralazine 100 mg 1 tab PO TID, Metoprolol 50 mg BID, clonidine 0.1 mg 1 tab by mouth daily when necessary  and Lasix 40 mg give 2 tabs = 80 mg daily  Hyperlipidemia - continue Atorvastatin 20 mg Q evening Lab Results  Component Value Date   CHOL 155 12/04/2015   HDL 39 12/04/2015   LDLCALC 84 12/04/2015   TRIG 157 12/04/2015    GERD - stable; continue Omeprazole 20 mg 1 capsule by mouth daily   Hypothyroidism - continue Levothyroxine 75 mcg daily 11/17/15  tsh 4.474  Anemia of chronic disease - hgb  8.3; recently started on Aranesp  200 g/0.4 mL subcutaneous monthly monthly  Chronic kidney disease, stage 5 - follows-up with nephrology Lab Results  Component Value Date   CREATININE 4.3 (A) 12/27/2015   Constipation - continue Senna S 2 tabs by mouth twice a day, MiraLAX 17 g by mouth twice a day and Dulcolax suppository 10 mg 1 rectally daily when necessary   Gout - continue allopurinol 100 mg 1 tab by mouth twice a day  Protein-calorie malnutrition - continue Procel 2 scoops PO BID    Goals of care:  Short-term care     Durenda Age, NP [place] [phone]

## 2016-01-24 LAB — BASIC METABOLIC PANEL
BUN: 84 mg/dL — AB (ref 4–21)
CREATININE: 4.1 mg/dL — AB (ref 0.5–1.1)
Glucose: 84 mg/dL
POTASSIUM: 4.6 mmol/L (ref 3.4–5.3)
SODIUM: 139 mmol/L (ref 137–147)

## 2016-02-21 ENCOUNTER — Encounter: Payer: Self-pay | Admitting: Adult Health

## 2016-02-21 NOTE — Progress Notes (Signed)
This encounter was created in error - please disregard.

## 2016-03-04 LAB — TSH: TSH: 4.37 u[IU]/mL (ref 0.41–5.90)

## 2016-03-04 LAB — CBC AND DIFFERENTIAL
HEMATOCRIT: 26 % — AB (ref 36–46)
HEMOGLOBIN: 8.2 g/dL — AB (ref 12.0–16.0)
Neutrophils Absolute: 4 /uL
Platelets: 256 10*3/uL (ref 150–399)
WBC: 8.1 10^3/mL

## 2016-03-20 ENCOUNTER — Encounter: Payer: Self-pay | Admitting: Adult Health

## 2016-03-20 LAB — BASIC METABOLIC PANEL
BUN: 65 mg/dL — AB (ref 4–21)
Creatinine: 4.5 mg/dL — AB (ref 0.5–1.1)
Glucose: 89 mg/dL
POTASSIUM: 4.7 mmol/L (ref 3.4–5.3)
Sodium: 143 mmol/L (ref 137–147)

## 2016-03-20 LAB — TSH: TSH: 3.11 u[IU]/mL (ref 0.41–5.90)

## 2016-03-20 NOTE — Progress Notes (Signed)
DATE:  03/20/2016 MRN:  [id number]  BIRTHDAY: Mar 28, 1940  Facility:  Nursing Home Location:  [place] Room Number: 1004-A  LEVEL OF CARE:  SNF [phone])  Contact Information    Name Relation Home Work [place] W Son   [phone]   [facility] Relative [phone]  [phone]       Code Status History    Date Active Date Inactive Code Status Order ID Comments User Context   11/14/2015  3:15 PM 11/15/2015  7:31 PM Full Code [id number]  Sindy Messing, PA-C Inpatient       Chief Complaint  Patient presents with  . Medical Management of Chronic Issues    HISTORY OF PRESENT ILLNESS:  This is a 24-YO female who is seen for a routine visit.      PAST MEDICAL HISTORY:  Past Medical History:  Diagnosis Date  . Anemia   . Chronic kidney disease    secondary hyperparathyroidism  . GERD (gastroesophageal reflux disease)   . Gout, unspecified   . Hyperlipidemia   . Hypertension   . Hypothyroid   . Monoclonal gammopathy   . Obesity   . Osteoarthritis   . Secondary hyperparathyroidism ([place])      CURRENT MEDICATIONS: Reviewed  Patient's Medications  New Prescriptions   No medications on file  Previous Medications   ACETAMINOPHEN (TYLENOL) 325 MG TABLET    Take 650 mg by mouth every 4 (four) hours as needed for mild pain.   ALLOPURINOL (ZYLOPRIM) 100 MG TABLET    Take 100 mg by mouth 2 (two) times daily.    AMLODIPINE (NORVASC) 5 MG TABLET    Take 5 mg by mouth every evening.    ATORVASTATIN (LIPITOR) 20 MG TABLET    Take 20 mg by mouth every evening.    BISACODYL (DULCOLAX) 10 MG SUPPOSITORY    Place 10 mg rectally daily as needed for moderate constipation.   CLONIDINE (CATAPRES) 0.1 MG TABLET    Take 0.1 mg by mouth as needed. Give if SBP >180 and/or DBP >110 on ESA injection daily only.  If BP within parameters after 30 minutes, give ESA injection.  If BP not within parameters notify MD at Fax [phone]   DARBEPOETIN ALFA (ARANESP) 200 MCG/0.4ML SOSY INJECTION    Inject 200 mcg into the skin every 28 (twenty-eight) days. Per [facility] last dose 10/17/15 (MD aware)   FUROSEMIDE (LASIX) 80 MG TABLET    Take 80 mg by mouth daily.   HYDRALAZINE (APRESOLINE) 100 MG TABLET    Take 100 mg by mouth 3 (three) times daily.    LEVOTHYROXINE (SYNTHROID, LEVOTHROID) 75 MCG TABLET    Take 75 mcg by mouth daily before breakfast.   METOPROLOL (LOPRESSOR) 50 MG TABLET    Take 50 mg by mouth 2 (two) times daily.    OMEPRAZOLE (PRILOSEC) 20 MG CAPSULE    Take 20 mg by mouth daily.   POLYETHYLENE GLYCOL (MIRALAX / GLYCOLAX) PACKET    Take 17 g by mouth 2 (two) times daily.   SENNOSIDES-DOCUSATE SODIUM (SENOKOT-S) 8.6-50 MG TABLET    Take 2 tablets by mouth 2 (two) times daily.   TRAMADOL (ULTRAM) 50 MG TABLET    Take 50 mg by mouth every 4 (four) hours as needed.  Modified Medications   No medications on file  Discontinued Medications   PROTEIN (PROCEL) POWD    Take 2 scoop by mouth 2 (two) times daily.  Allergies  Allergen Reactions  . Hydrocodone Bitart (Antituss) [Hydrocodone] Other (See Comments)    UNSPECIFIED   . Lyrica [Pregabalin] Other (See Comments)    UNSPECIFIED   . Penicillins Other (See Comments)    UNSPECIFIED  Has patient had a PCN reaction causing immediate rash, facial/tongue/throat swelling, SOB or lightheadedness with hypotension: unknown Has patient had a PCN reaction causing severe rash involving mucus membranes or skin necrosis: unknown Has patient had a PCN reaction that required hospitalization: unknown Has patient had a PCN reaction occurring within the last 10 years: no If all of the above answers are "NO", then may proceed with Cephalosporin use.      REVIEW OF SYSTEMS:  GENERAL: no change in appetite, no fatigue, no weight changes, no fever, chills or weakness SKIN: Denies rash, itching, wounds, ulcer sores, or nail abnormality EYES: Denies change in vision, dry  eyes, eye pain, itching or discharge EARS: Denies change in hearing, ringing in ears, or earache NOSE: Denies nasal congestion or epistaxis MOUTH and THROAT: Denies oral discomfort, gingival pain or bleeding, pain from teeth or hoarseness   RESPIRATORY: no cough, SOB, DOE, wheezing, hemoptysis CARDIAC: no chest pain, edema or palpitations GI: no abdominal pain, diarrhea, constipation, heart burn, nausea or vomiting GU: Denies dysuria, frequency, hematuria, incontinence, or discharge MUSCULOSKELETAL: Denies joit pain, muscle pain, back pain, restricted movement, or unusual weakness CIRCULATION: Denies claudication, edema of legs, varicosities, or cold extremities NEUROLOGICAL: Denies dizziness, syncope, numbness, or headache PSYCHIATRIC: Denies feeling of depression or anxiety. No report of hallucinations, insomnia, paranoia, or agitation ENDOCRINE: Denies polyphagia, polyuria, polydipsia, heat or cold intolerance HEME/LYMPH: Denies excessive bruising, petechia, enlarged lymph nodes, or bleeding problems IMMUNOLOGIC: Denies history of frequent infections, AIDS, or use of immunosuppressive agents    PHYSICAL EXAMINATION  GENERAL APPEARANCE: Well nourished. In no acute distress. Normal body habitus SKIN:  Skin is warm and dry. There are no suspicious lesions or rash HEAD: Normal in size and contour. No evidence of trauma EYES: Lids open and close normally. No blepharitis, entropion or ectropion. PERRL. Conjunctivae are clear and sclerae are white. Lenses are without opacity EARS: Pinnae are normal. Patient hears normal voice tunes of the examiner MOUTH and THROAT: Lips are without lesions. Oral mucosa is moist and without lesions. Tongue is normal in shape, size, and color and without lesions NECK: supple, trachea midline, no neck masses, no thyroid tenderness, no thyromegaly LYMPHATICS: no LAN in the neck, no supraclavicular LAN RESPIRATORY: breathing is even & unlabored, BS CTAB CARDIAC:  RRR, no murmur,no extra heart sounds, no edema GI: abdomen soft, normal BS, no masses, no tenderness, no hepatomegaly, no splenomegaly MUSCULOSKELETAL: No deformities. Movement at each extremity is full and painless. Strength is 5/5 at each extremity. Back is without kyphosis or scoliosis CIRCULATION: pedal pulses are 2+. There is no edema of the legs, ankles and feet NEUROLOGICAL: There is no tremor. Speech is clear PSYCHIATRIC: Alert and oriented X 3. Affect and behavior are appropriate  LABS/RADIOLOGY: Labs reviewed: Basic Metabolic Panel:  Recent Labs  08/25/15 1035 09/22/15 1030 10/20/15 1030 11/14/15 0816  12/25/15 12/27/15 01/24/16  NA 138 139 136 136  < > 139 141 139  K 3.7 4.1 3.6 4.2  < > 4.7 4.6 4.6  CL 103 107 104 99*  --   --   --   --   CO2 23 23 23 25   --   --   --   --   GLUCOSE 107* 100* 97 127*  --   --   --   --  BUN 68* 69* 69* 75*  < > 98* 93* 84*  CREATININE 5.09* 4.23* 4.40* 4.30*  < > 4.3* 4.3* 4.1*  CALCIUM 10.1  9.7 10.5* 10.1 10.0  --   --   --   --   PHOS 3.6 3.6 3.6  --   --   --   --   --   < > = values in this interval not displayed. Liver Function Tests:  Recent Labs  08/25/15 1035 09/22/15 1030 10/20/15 1030 12/04/15  AST  --   --   --  12*  ALT  --   --   --  8  ALKPHOS  --   --   --  64  ALBUMIN 3.6 3.5 3.8  --     CBC:  Recent Labs  11/14/15 0816 12/07/15 12/25/15 03/04/16  WBC 11.6* 8.6 8.5 8.1  NEUTROABS  --  4 4 4   HGB 10.0* 8.3* 8.5* 8.2*  HCT 31.2* 27* 27* 26*  MCV 92.6  --   --   --   PLT 324 303 283 256    Lipid Panel:  Recent Labs  12/04/15  HDL 39    ASSESSMENT/PLAN:          Elmore Guise, [facility] [phone]

## 2016-03-28 LAB — CBC AND DIFFERENTIAL
HEMATOCRIT: 28 % — AB (ref 36–46)
HEMOGLOBIN: 9.1 g/dL — AB (ref 12.0–16.0)
Platelets: 243 10*3/uL (ref 150–399)
WBC: 6 10^3/mL

## 2016-04-04 NOTE — Progress Notes (Signed)
This encounter was created in error - please disregard.

## 2016-04-17 LAB — BASIC METABOLIC PANEL
BUN: 47 mg/dL — AB (ref 4–21)
Creatinine: 3.8 mg/dL — AB (ref 0.5–1.1)
GLUCOSE: 84 mg/dL
Potassium: 3.9 mmol/L (ref 3.4–5.3)
SODIUM: 141 mmol/L (ref 137–147)

## 2016-04-24 LAB — CBC AND DIFFERENTIAL
HEMATOCRIT: 29 % — AB (ref 36–46)
HEMOGLOBIN: 9 g/dL — AB (ref 12.0–16.0)
PLATELETS: 321 10*3/uL (ref 150–399)
WBC: 6.4 10*3/mL

## 2016-04-26 ENCOUNTER — Emergency Department (HOSPITAL_COMMUNITY): Payer: Medicare Other

## 2016-04-26 ENCOUNTER — Emergency Department (HOSPITAL_COMMUNITY)
Admission: EM | Admit: 2016-04-26 | Discharge: 2016-04-27 | Disposition: A | Discharge status: Home / Self Care | Payer: Medicare Other | Attending: Emergency Medicine | Admitting: Emergency Medicine

## 2016-04-26 ENCOUNTER — Encounter (HOSPITAL_COMMUNITY): Payer: Self-pay | Admitting: Emergency Medicine

## 2016-04-26 DIAGNOSIS — M542 Cervicalgia: Secondary | ICD-10-CM | POA: Diagnosis present

## 2016-04-26 DIAGNOSIS — Z79899 Other long term (current) drug therapy: Secondary | ICD-10-CM | POA: Insufficient documentation

## 2016-04-26 DIAGNOSIS — N189 Chronic kidney disease, unspecified: Secondary | ICD-10-CM | POA: Insufficient documentation

## 2016-04-26 DIAGNOSIS — M436 Torticollis: Secondary | ICD-10-CM

## 2016-04-26 DIAGNOSIS — E039 Hypothyroidism, unspecified: Secondary | ICD-10-CM | POA: Diagnosis not present

## 2016-04-26 DIAGNOSIS — R609 Edema, unspecified: Secondary | ICD-10-CM

## 2016-04-26 DIAGNOSIS — I129 Hypertensive chronic kidney disease with stage 1 through stage 4 chronic kidney disease, or unspecified chronic kidney disease: Secondary | ICD-10-CM | POA: Diagnosis not present

## 2016-04-26 MED ORDER — FENTANYL CITRATE (PF) 100 MCG/2ML IJ SOLN
25.0000 ug | Freq: Once | INTRAMUSCULAR | Status: AC
  Administered 2016-04-27: 25 ug via INTRAVENOUS
  Filled 2016-04-26: qty 2

## 2016-04-26 MED ORDER — IOPAMIDOL (ISOVUE-300) INJECTION 61%: 75.0000 mL | Freq: Once | INTRAVENOUS | Status: DC | PRN

## 2016-04-26 MED ORDER — ONDANSETRON HCL 4 MG/2ML IJ SOLN
4.0000 mg | Freq: Once | INTRAMUSCULAR | Status: AC
  Administered 2016-04-27: 4 mg via INTRAVENOUS
  Filled 2016-04-26: qty 2

## 2016-04-26 MED ORDER — IOPAMIDOL (ISOVUE-300) INJECTION 61%
INTRAVENOUS | Status: AC
  Filled 2016-04-26: qty 75

## 2016-04-26 NOTE — ED Notes (Signed)
Bed: [id number] Expected date:  Expected time:  Means of arrival:  Comments: ems

## 2016-04-26 NOTE — ED Provider Notes (Signed)
[facility] DEPT Provider Note   CSN: [id number] Arrival date & time: 04/26/16  2257  By signing my name below, I, Jeanell Sparrow, attest that this documentation has been prepared under the direction and in the presence of non-physician practitioner, Antonietta Breach, PA-C. Electronically Signed: Jeanell Sparrow, Scribe. 04/26/2016. 11:21 PM.   History   Chief Complaint Chief Complaint  Patient presents with  . Neck Pain    The history is provided by the patient. No language interpreter was used.   HPI Comments: Bonnie Alvarez is a 76 y.o. female who presents to the Emergency Department complaining of constant moderate neck pain that started about 3 days ago. She states she had an X-ray done at her PCP office which revealed "narrowed airway" with anterior neck swelling. Her pain is exacerbated by neck movement. She initially thought she had a "crick in her neck". She took tylenol PTA without relief. She reports associated neck stiffness and difficulty swallowing (able to swallow water and tolerating secretions). She denies any fever or other complaints.   PCP: Jilda Panda, MD   Past Medical History:  Diagnosis Date  . Anemia   . Chronic kidney disease    secondary hyperparathyroidism  . GERD (gastroesophageal reflux disease)   . Gout, unspecified   . Hyperlipidemia   . Hypertension   . Hypothyroid   . Monoclonal gammopathy   . Obesity   . Osteoarthritis   . Secondary hyperparathyroidism [facility])     Patient Active Problem List   Diagnosis Date Noted  . Closed right ankle fracture 11/14/2015  . Anemia of chronic disease 10/12/2015    Past Surgical History:  Procedure Laterality Date  . BACK SURGERY    . COLONOSCOPY W/ POLYPECTOMY    . ORIF ANKLE FRACTURE Right 11/14/2015   Procedure: OPEN REDUCTION INTERNAL FIXATION (ORIF) RIGHT ANKLE FRACTURE;  Surgeon: Renette Butters, MD;  Location: [facility];  Service: Orthopedics;  Laterality: Right;    OB History    No data available         Home Medications    Prior to Admission medications   Medication Sig Start Date End Date Taking? Authorizing Provider  allopurinol (ZYLOPRIM) 100 MG tablet Take 100 mg by mouth 2 (two) times daily.  08/26/12  Yes Historical Provider, MD  amLODipine (NORVASC) 5 MG tablet Take 5 mg by mouth every evening.    Yes Historical Provider, MD  atorvastatin (LIPITOR) 20 MG tablet Take 20 mg by mouth every evening.  08/26/12  Yes Historical Provider, MD  bisacodyl (DULCOLAX) 10 MG suppository Place 10 mg rectally daily as needed for moderate constipation.   Yes Historical Provider, MD  cloNIDine (CATAPRES) 0.1 MG tablet Take 0.1 mg by mouth as needed. Give if SBP >180 and/or DBP >110 on ESA injection daily only.  If BP within parameters after 30 minutes, give ESA injection.  If BP not within parameters notify MD at Fax [phone]   Yes Historical Provider, MD  Darbepoetin Alfa (ARANESP) 200 MCG/0.4ML SOSY injection Inject 200 mcg into the skin every 28 (twenty-eight) days. Per [facility] last dose 10/17/15 (MD aware)   Yes Historical Provider, MD  ferrous sulfate 325 (65 FE) MG EC tablet Take 325 mg by mouth 2 (two) times daily.   Yes Historical Provider, MD  furosemide (LASIX) 80 MG tablet Take 80 mg by mouth daily.   Yes Historical Provider, MD  hydrALAZINE (APRESOLINE) 100 MG tablet Take 100 mg by mouth 3 (three) times daily.    Yes Historical  Provider, MD  levothyroxine (SYNTHROID, LEVOTHROID) 75 MCG tablet Take 75 mcg by mouth daily before breakfast.   Yes Historical Provider, MD  loratadine (CLARITIN) 10 MG tablet Take 10 mg by mouth daily.   Yes Historical Provider, MD  Menthol, Topical Analgesic, (BIOFREEZE) 4 % GEL Apply 1 application topically 2 (two) times daily. APPLY TO NECK   Yes Historical Provider, MD  metoprolol (LOPRESSOR) 50 MG tablet Take 50 mg by mouth 2 (two) times daily.    Yes Historical Provider, MD  omeprazole (PRILOSEC) 20 MG capsule Take 20 mg by mouth daily.   Yes  Historical Provider, MD  polyethylene glycol (MIRALAX / GLYCOLAX) packet Take 17 g by mouth 2 (two) times daily.    Yes Historical Provider, MD  sennosides-docusate sodium (SENOKOT-S) 8.6-50 MG tablet Take 2 tablets by mouth 2 (two) times daily.   Yes Historical Provider, MD  traMADol (ULTRAM) 50 MG tablet Take 50 mg by mouth every 4 (four) hours as needed for moderate pain or severe pain.    Yes Historical Provider, MD  acetaminophen (TYLENOL) 500 MG tablet Take 1 tablet (500 mg total) by mouth every 4 (four) hours as needed for mild pain or moderate pain. 04/27/16   Antonietta Breach, PA-C  diazepam (VALIUM) 2 MG tablet Take 1 tablet (2 mg total) by mouth every 6 (six) hours as needed for muscle spasms. 04/27/16   Antonietta Breach, PA-C    Family History No family history on file.  Social History Social History  Substance Use Topics  . Smoking status: Never Smoker  . Smokeless tobacco: Never Used  . Alcohol use No     Allergies   Hydrocodone bitart (antituss) [hydrocodone]; Lyrica [pregabalin]; and Penicillins   Review of Systems Review of Systems A complete 10 system review of systems was obtained and all systems are negative except as noted in the HPI and PMH.    Physical Exam Updated Vital Signs BP 189/89 (BP Location: Right Arm)   Pulse 88   Temp 98.6 F (37 C) (Oral)   Resp (!) 28   SpO2 98%   Physical Exam  Constitutional: She is oriented to person, place, and time. She appears well-developed and well-nourished. No distress.  HENT:  Head: Normocephalic and atraumatic.  Mild trismus appreciated secondary to pain. What is visualized of the posterior oropharynx is clear. Patient tolerating secretions without difficulty.  Eyes: Conjunctivae and EOM are normal. No scleral icterus.  Neck:  Decreased ROM of the neck secondary to pain. There is mild soft tissue swelling to the right lateral neck, extending to the supraclavicular region. Also mild swelling similarly on the left. No  stridor.  Cardiovascular: Normal rate, regular rhythm and intact distal pulses.   Pulmonary/Chest: Effort normal. No respiratory distress. She has no wheezes. She has no rales.  Respirations even and unlabored.  Musculoskeletal: Normal range of motion.  Neurological: She is alert and oriented to person, place, and time. She exhibits normal muscle tone. Coordination normal.  GCS 15. Patient answers questions appropriately. She follows commands. Patient moving all extremities. Equal grip strength bilaterally.  Skin: Skin is warm and dry. No rash noted. She is not diaphoretic. No erythema. No pallor.  Psychiatric: She has a normal mood and affect. Her behavior is normal.  Nursing note and vitals reviewed.    ED Treatments / Results  DIAGNOSTIC STUDIES: Oxygen Saturation is 98% on RA, normal by my interpretation.    COORDINATION OF CARE: 11:25 PM- Pt advised of plan for treatment  and pt agrees.  Labs (all labs ordered are listed, but only abnormal results are displayed) Labs Reviewed  CBC WITH DIFFERENTIAL/PLATELET - Abnormal; Notable for the following:       Result Value   WBC 12.9 (*)    RBC 3.69 (*)    Hemoglobin 10.7 (*)    HCT 32.8 (*)    RDW 16.4 (*)    Neutro Abs 9.2 (*)    Monocytes Absolute 1.5 (*)    All other components within normal limits  BASIC METABOLIC PANEL - Abnormal; Notable for the following:    Sodium 134 (*)    Potassium 3.4 (*)    CO2 21 (*)    Glucose, Bld 118 (*)    BUN 43 (*)    Creatinine, Ser 3.33 (*)    GFR calc non Af Amer 12 (*)    GFR calc Af Amer 14 (*)    All other components within normal limits  I-STAT CREATININE, ED - Abnormal; Notable for the following:    Creatinine, Ser 3.30 (*)    All other components within normal limits  I-STAT CG4 LACTIC ACID, ED    EKG  EKG Interpretation None       Radiology Dg Chest 2 View  Addendum Date: 04/27/2016   ADDENDUM REPORT: 04/27/2016 03:02 ADDENDUM: Comparison made with neck CT  04/27/2016. Mass in the upper mediastinum corresponds to enlarged thyroid/multinodular goiter. Electronically Signed   By: Donavan Foil M.D.   On: 04/27/2016 03:02   Result Date: 04/27/2016 CLINICAL DATA:  Swelling to the right side of head EXAM: CHEST  2 VIEW COMPARISON:  None. FINDINGS: No acute infiltrate or effusion. Normal heart size. Atherosclerosis of the aorta. No pneumothorax. Upper mediastinal opacity with mild deviation of the trachea to the right. IMPRESSION: 1. No acute infiltrate or edema 2. Upper mediastinal opacity with deviation of the trachea to the right. Findings could be secondary to mediastinal mass or vascular abnormality. Recommend CT neck/ chest for further evaluation. Electronically Signed: By: Donavan Foil M.D. On: 04/27/2016 02:52   Ct Soft Tissue Neck Wo Contrast  Result Date: 04/27/2016 CLINICAL DATA:  76 y/o F; neck pain and swelling on the right-sided neck. EXAM: CT NECK WITHOUT CONTRAST TECHNIQUE: Multidetector CT imaging of the neck was performed following the standard protocol without intravenous contrast. COMPARISON:  None. FINDINGS: Pharynx and larynx: Normal. No mass or swelling. Salivary glands: No inflammation, mass, or stone. Thyroid: Multinodular goiter. Lymph nodes: None enlarged or abnormal density. Vascular: Aortic atherosclerosis with moderate calcification. Calcific atherosclerosis of carotid bifurcations and retropharyngeal submucosal course of bilateral carotid bifurcations. Limited intracranial: Negative. Visualized orbits: Negative. Mastoids and visualized paranasal sinuses: Clear. Skeleton: Severe cervical spondylosis with predominantly discogenic degenerative disease an reversal of cervical lordosis. No listhesis. Disc and facet degenerative changes results in multiple levels of at least moderate canal stenosis and foraminal narrowing from C3 through C6. Upper chest: Negative. Other: None. IMPRESSION: 1. Markedly enlarge multinodular thyroid goiter. 2. No  other mass or inflammatory process of the neck identified. 3. Severe cervical spondylosis with multiple levels of canal stenosis and foraminal narrowing, at least moderate from the C3 through C5 levels. 4. Calcific atherosclerosis of the aorta and carotid bifurcations. Electronically Signed   By: Kristine Garbe M.D.   On: 04/27/2016 01:51    Procedures Procedures (including critical care time)  Medications Ordered in ED Medications  traMADol (ULTRAM) tablet 50 mg (not administered)  fentaNYL (SUBLIMAZE) injection 25 mcg (25 mcg Intravenous Given  04/27/16 0044)  ondansetron (ZOFRAN) injection 4 mg (4 mg Intravenous Given 04/27/16 0042)  methylPREDNISolone sodium succinate (SOLU-MEDROL) 125 mg/2 mL injection 125 mg (125 mg Intravenous Given 04/27/16 0225)     Initial Impression / Assessment and Plan / ED Course  I have reviewed the triage vital signs and the nursing notes.  Pertinent labs & imaging results that were available during my care of the patient were reviewed by me and considered in my medical decision making (see chart for details).     77 year old female presents to the emergency department for evaluation of neck pain. Pain is worse with movement and has been present for 3 days. The patient has had no difficulty swallowing. No signs of respiratory distress. No hypoxia. She was sent to the emergency department for further evaluation after an x-ray by her primary care doctor showed questionable "narrowed airway".  CT soft tissue neck was obtained today. This shows a multinodular thyroid goiter without changes to the syrinx or larynx. No airway narrowing on CT. No other mass identified. There is evidence of spondylosis and stenosis, but this is likely chronic. Patient is neurovascularly intact. She has equal grip strength.  Patient treated in the emergency department with pain medication as well as a shot of IV steroids. Suspect pain to be secondary to torticollis.  Instructions given for supportive management. Patient discharged in stable condition. Patient and family with no unaddressed concerns.   Vitals:   04/27/16 0146 04/27/16 0219 04/27/16 0221 04/27/16 0322  BP:  181/86 186/87 180/90  Pulse:  88 89 89  Resp:  22 24 19   Temp:  98.6 F (37 C) 98.6 F (37 C) 98.3 F (36.8 C)  TempSrc:  Oral Oral Oral  SpO2: 96% 95% 95% 96%    Final Clinical Impressions(s) / ED Diagnoses   Final diagnoses:  Torticollis, acute    New Prescriptions New Prescriptions   ACETAMINOPHEN (TYLENOL) 500 MG TABLET    Take 1 tablet (500 mg total) by mouth every 4 (four) hours as needed for mild pain or moderate pain.   DIAZEPAM (VALIUM) 2 MG TABLET    Take 1 tablet (2 mg total) by mouth every 6 (six) hours as needed for muscle spasms.   I personally performed the services described in this documentation, which was scribed in my presence. The recorded information has been reviewed and is accurate.       Antonietta Breach, PA-C 04/27/16 [id number]    April Palumbo, MD 04/27/16 [phone]

## 2016-04-26 NOTE — ED Triage Notes (Signed)
Pt via EMS for neck pain since Tuesday.  XR done at PCP, showed narrowed airway & swelling anteriorly from C2 to C6 but no fracture.  Swelling R side neck, reports difficulty swallowing and tingling in hands.  Hx htn.  Took toradol (no relief) and biofreeze (some reflief). VS: 100% RA, 20 RR, 176/100, CBG 136

## 2016-04-27 ENCOUNTER — Emergency Department (HOSPITAL_COMMUNITY): Payer: Medicare Other

## 2016-04-27 DIAGNOSIS — M436 Torticollis: Secondary | ICD-10-CM | POA: Diagnosis not present

## 2016-04-27 LAB — CBC WITH DIFFERENTIAL/PLATELET
BASOS ABS: 0 10*3/uL (ref 0.0–0.1)
BASOS PCT: 0 %
EOS ABS: 0 10*3/uL (ref 0.0–0.7)
EOS PCT: 0 %
HCT: 32.8 % — ABNORMAL LOW (ref 36.0–46.0)
Hemoglobin: 10.7 g/dL — ABNORMAL LOW (ref 12.0–15.0)
Lymphocytes Relative: 17 %
Lymphs Abs: 2.1 10*3/uL (ref 0.7–4.0)
MCH: 29 pg (ref 26.0–34.0)
MCHC: 32.6 g/dL (ref 30.0–36.0)
MCV: 88.9 fL (ref 78.0–100.0)
Monocytes Absolute: 1.5 10*3/uL — ABNORMAL HIGH (ref 0.1–1.0)
Monocytes Relative: 12 %
NEUTROS PCT: 71 %
Neutro Abs: 9.2 10*3/uL — ABNORMAL HIGH (ref 1.7–7.7)
PLATELETS: 362 10*3/uL (ref 150–400)
RBC: 3.69 MIL/uL — AB (ref 3.87–5.11)
RDW: 16.4 % — ABNORMAL HIGH (ref 11.5–15.5)
WBC: 12.9 10*3/uL — AB (ref 4.0–10.5)

## 2016-04-27 LAB — BASIC METABOLIC PANEL
ANION GAP: 12 (ref 5–15)
BUN: 43 mg/dL — AB (ref 6–20)
CO2: 21 mmol/L — ABNORMAL LOW (ref 22–32)
Calcium: 10 mg/dL (ref 8.9–10.3)
Chloride: 101 mmol/L (ref 101–111)
Creatinine, Ser: 3.33 mg/dL — ABNORMAL HIGH (ref 0.44–1.00)
GFR, EST AFRICAN AMERICAN: 14 mL/min — AB (ref >60)
GFR, EST NON AFRICAN AMERICAN: 12 mL/min — AB (ref >60)
Glucose, Bld: 118 mg/dL — ABNORMAL HIGH (ref 65–99)
Potassium: 3.4 mmol/L — ABNORMAL LOW (ref 3.5–5.1)
SODIUM: 134 mmol/L — AB (ref 135–145)

## 2016-04-27 LAB — I-STAT CG4 LACTIC ACID, ED: LACTIC ACID, VENOUS: 1.34 mmol/L (ref 0.5–1.9)

## 2016-04-27 LAB — I-STAT CREATININE, ED: CREATININE: 3.3 mg/dL — AB (ref 0.44–1.00)

## 2016-04-27 MED ORDER — TRAMADOL HCL 50 MG PO TABS
50.0000 mg | ORAL_TABLET | Freq: Once | ORAL | Status: AC
  Administered 2016-04-27: 50 mg via ORAL
  Filled 2016-04-27: qty 1

## 2016-04-27 MED ORDER — METHYLPREDNISOLONE SODIUM SUCC 125 MG IJ SOLR
125.0000 mg | Freq: Once | INTRAMUSCULAR | Status: AC
  Administered 2016-04-27: 125 mg via INTRAVENOUS
  Filled 2016-04-27: qty 2

## 2016-04-27 MED ORDER — ACETAMINOPHEN 500 MG PO TABS: 500.0000 mg | ORAL_TABLET | ORAL | 0 refills | Status: AC | PRN

## 2016-04-27 MED ORDER — DIAZEPAM 2 MG PO TABS: 2.0000 mg | ORAL_TABLET | Freq: Four times a day (QID) | ORAL | 0 refills | Status: DC | PRN

## 2016-04-27 NOTE — Discharge Instructions (Signed)
The mass on your x-ray corresponds with your known thyroid goiter. We believe that your pain is due to torticollis; a muscle spasm in your neck. Continue with supportive management and follow-up with your primary care doctor if symptoms persist. You may return for any new or concerning symptoms.

## 2016-04-29 ENCOUNTER — Emergency Department (HOSPITAL_COMMUNITY)
Admission: EM | Admit: 2016-04-29 | Discharge: 2016-04-30 | Disposition: A | Discharge status: Home / Self Care | Payer: Medicare Other | Attending: Emergency Medicine | Admitting: Emergency Medicine

## 2016-04-29 ENCOUNTER — Encounter (HOSPITAL_COMMUNITY): Payer: Self-pay | Admitting: *Deleted

## 2016-04-29 DIAGNOSIS — N189 Chronic kidney disease, unspecified: Secondary | ICD-10-CM | POA: Diagnosis not present

## 2016-04-29 DIAGNOSIS — Z79899 Other long term (current) drug therapy: Secondary | ICD-10-CM | POA: Diagnosis not present

## 2016-04-29 DIAGNOSIS — M436 Torticollis: Secondary | ICD-10-CM

## 2016-04-29 DIAGNOSIS — M542 Cervicalgia: Secondary | ICD-10-CM | POA: Diagnosis present

## 2016-04-29 DIAGNOSIS — E039 Hypothyroidism, unspecified: Secondary | ICD-10-CM | POA: Diagnosis not present

## 2016-04-29 DIAGNOSIS — I129 Hypertensive chronic kidney disease with stage 1 through stage 4 chronic kidney disease, or unspecified chronic kidney disease: Secondary | ICD-10-CM | POA: Diagnosis not present

## 2016-04-29 MED ORDER — MIDAZOLAM HCL 2 MG/2ML IJ SOLN
0.5000 mg | Freq: Once | INTRAMUSCULAR | Status: AC
  Administered 2016-04-30: 0.5 mg via INTRAVENOUS
  Filled 2016-04-29: qty 2

## 2016-04-29 MED ORDER — MORPHINE SULFATE (PF) 2 MG/ML IV SOLN
2.0000 mg | Freq: Once | INTRAVENOUS | Status: AC
  Administered 2016-04-30: 2 mg via INTRAVENOUS
  Filled 2016-04-29: qty 1

## 2016-04-29 MED ORDER — ONDANSETRON HCL 4 MG/2ML IJ SOLN
4.0000 mg | Freq: Once | INTRAMUSCULAR | Status: AC
  Administered 2016-04-30: 4 mg via INTRAVENOUS
  Filled 2016-04-29: qty 2

## 2016-04-29 MED ORDER — IBUPROFEN 800 MG PO TABS
800.0000 mg | ORAL_TABLET | Freq: Once | ORAL | Status: AC
  Administered 2016-04-30: 800 mg via ORAL
  Filled 2016-04-29: qty 1

## 2016-04-29 NOTE — ED Notes (Signed)
Bed: [facility] Expected date:  Expected time:  Means of arrival:  Comments: 76 right sided neck/shoulder pain

## 2016-04-29 NOTE — ED Provider Notes (Signed)
[facility] DEPT Provider Note   CSN: [id number] Arrival date & time: 04/29/16  2109  By signing my name below, I, Bonnie Alvarez, attest that this documentation has been prepared under the direction and in the presence of Bonnie Greek, MD. Electronically Signed: Oleh Alvarez, Scribe. 04/29/16. 11:47 PM.   History   Chief Complaint No chief complaint on file.   HPI Bonnie Alvarez is a 76 y.o. female with history of hypothyroidism and obesity who presents to the ED for evaluation of neck pain. This patient was initially seen at this facility 3 days ago with R sided neck swelling and pain where a CT soft tissue neck was order. Imaging demonstrated a multinodular thyroid goiter. She was given a dose of IV steroids; discharged on Valium. Following discharge the patient states that her pain symptoms have worsened. At interview, she continues to deny any difficulty respirating or swallowing. She does report some difficulty fully ranging her neck. She is also reportedly taking Keflex which was prescribed by her PCP.   The history is provided by the patient. No language interpreter was used.    Past Medical History:  Diagnosis Date  . Anemia   . Chronic kidney disease    secondary hyperparathyroidism  . GERD (gastroesophageal reflux disease)   . Gout, unspecified   . Hyperlipidemia   . Hypertension   . Hypothyroid   . Monoclonal gammopathy   . Obesity   . Osteoarthritis   . Secondary hyperparathyroidism [facility])     Patient Active Problem List   Diagnosis Date Noted  . Closed right ankle fracture 11/14/2015  . Anemia of chronic disease 10/12/2015    Past Surgical History:  Procedure Laterality Date  . BACK SURGERY    . COLONOSCOPY W/ POLYPECTOMY    . ORIF ANKLE FRACTURE Right 11/14/2015   Procedure: OPEN REDUCTION INTERNAL FIXATION (ORIF) RIGHT ANKLE FRACTURE;  Surgeon: Renette Butters, MD;  Location: [facility] [place] [facility];  Service: Orthopedics;  Laterality: Right;    OB  History    No data available       Home Medications    Prior to Admission medications   Medication Sig Start Date End Date Taking? Authorizing Provider  acetaminophen (TYLENOL) 500 MG tablet Take 1 tablet (500 mg total) by mouth every 4 (four) hours as needed for mild pain or moderate pain. 04/27/16  Yes Antonietta Breach, PA-C  allopurinol (ZYLOPRIM) 100 MG tablet Take 100 mg by mouth 2 (two) times daily.  08/26/12  Yes Historical Provider, MD  amLODipine (NORVASC) 5 MG tablet Take 5 mg by mouth every evening.    Yes Historical Provider, MD  atorvastatin (LIPITOR) 20 MG tablet Take 20 mg by mouth every evening.  08/26/12  Yes Historical Provider, MD  clindamycin (CLEOCIN) 300 MG capsule Take 300 mg by mouth 3 (three) times daily. x7 days 04/29/16  Yes Historical Provider, MD  cloNIDine (CATAPRES) 0.1 MG tablet Take 0.1 mg by mouth as needed. Give if SBP >180 and/or DBP >110 on ESA injection daily only.  If BP within parameters after 30 minutes, give ESA injection.  If BP not within parameters notify MD at Fax [phone]   Yes Historical Provider, MD  Darbepoetin Alfa (ARANESP) 200 MCG/0.4ML SOSY injection Inject 200 mcg into the skin every 28 (twenty-eight) days. Per [facility] last dose 10/17/15 (MD aware)   Yes Historical Provider, MD  diazepam (VALIUM) 2 MG tablet Take 1 tablet (2 mg total) by mouth every 6 (six) hours as needed for muscle  spasms. 04/27/16  Yes Antonietta Breach, PA-C  furosemide (LASIX) 80 MG tablet Take 80 mg by mouth daily.   Yes Historical Provider, MD  hydrALAZINE (APRESOLINE) 100 MG tablet Take 100 mg by mouth 3 (three) times daily.    Yes Historical Provider, MD  levothyroxine (SYNTHROID, LEVOTHROID) 75 MCG tablet Take 75 mcg by mouth daily before breakfast.   Yes Historical Provider, MD  loratadine (CLARITIN) 10 MG tablet Take 10 mg by mouth daily.   Yes Historical Provider, MD  Menthol, Topical Analgesic, (BIOFREEZE) 4 % GEL Apply 1 application topically 2 (two) times daily. APPLY  TO NECK   Yes Historical Provider, MD  metoprolol (LOPRESSOR) 50 MG tablet Take 50 mg by mouth 2 (two) times daily.    Yes Historical Provider, MD  omeprazole (PRILOSEC) 20 MG capsule Take 20 mg by mouth daily.   Yes Historical Provider, MD  polyethylene glycol (MIRALAX / GLYCOLAX) packet Take 17 g by mouth 2 (two) times daily.    Yes Historical Provider, MD  sennosides-docusate sodium (SENOKOT-S) 8.6-50 MG tablet Take 2 tablets by mouth 2 (two) times daily.   Yes Historical Provider, MD  traMADol (ULTRAM) 50 MG tablet Take 50 mg by mouth every 4 (four) hours as needed for moderate pain or severe pain.    Yes Historical Provider, MD    Family History No family history on file.  Social History Social History  Substance Use Topics  . Smoking status: Never Smoker  . Smokeless tobacco: Never Used  . Alcohol use No     Allergies   Hydrocodone bitart (antituss) [hydrocodone]; Lyrica [pregabalin]; and Penicillins   Review of Systems Review of Systems  HENT:       No difficulty respirating.   Musculoskeletal:       Neck swelling/pain, and decreased ROM.   All other systems reviewed and are negative.    Physical Exam Updated Vital Signs BP 162/75 (BP Location: Left Arm)   Pulse 94   Temp 98.5 F (36.9 C) (Oral)   Resp 19   SpO2 96%   Physical Exam  Constitutional: She is oriented to person, place, and time. She appears well-developed and well-nourished. No distress.  HENT:  Head: Normocephalic and atraumatic.  Right Ear: Hearing normal.  Left Ear: Hearing normal.  Nose: Nose normal.  Mouth/Throat: Oropharynx is clear and moist and mucous membranes are normal.  Eyes: Conjunctivae and EOM are normal. Pupils are equal, round, and reactive to light.  Neck: Neck supple.  There are severe spasms in the R paracervical muscles. Head is slightly tilted to the Right. There is tenderness on palpation to the R side of the neck. Slightly decreased ROM of the neck due to pain.     Cardiovascular: Regular rhythm, S1 normal and S2 normal.  Exam reveals no gallop and no friction rub.   No murmur heard. Pulmonary/Chest: Effort normal and breath sounds normal. No respiratory distress. She exhibits no tenderness.  Abdominal: Soft. Normal appearance and bowel sounds are normal. There is no hepatosplenomegaly. There is no tenderness. There is no rebound, no guarding, no tenderness at McBurney's point and negative Murphy's sign. No hernia.  Musculoskeletal: Normal range of motion.  Neurological: She is alert and oriented to person, place, and time. She has normal strength. No cranial nerve deficit or sensory deficit. Coordination normal. GCS eye subscore is 4. GCS verbal subscore is 5. GCS motor subscore is 6.  Skin: Skin is warm, dry and intact. No rash noted. No cyanosis.  Psychiatric: Her speech is normal and behavior is normal. Thought content normal. Her mood appears anxious.  Nursing note and vitals reviewed.    ED Treatments / Results  DIAGNOSTIC STUDIES: Oxygen Saturation is 99 percent on room air which is normal by my interpretation.    COORDINATION OF CARE: 11:37 PM Discussed treatment plan with pt at bedside and pt agreed to plan.  Labs (all labs ordered are listed, but only abnormal results are displayed) Labs Reviewed - No data to display  EKG  EKG Interpretation None       Radiology No results found.  Procedures Procedures (including critical care time)  Medications Ordered in ED Medications  ibuprofen (ADVIL,MOTRIN) tablet 800 mg (800 mg Oral Given 04/30/16 0030)  morphine 2 MG/ML injection 2 mg (2 mg Intravenous Given 04/30/16 0030)  ondansetron (ZOFRAN) injection 4 mg (4 mg Intravenous Given 04/30/16 0030)  midazolam (VERSED) injection 0.5 mg (0.5 mg Intravenous Given 04/30/16 0030)     Initial Impression / Assessment and Plan / ED Course  I have reviewed the triage vital signs and the nursing notes.  Pertinent labs & imaging results that  were available during my care of the patient were reviewed by me and considered in my medical decision making (see chart for details).     Patient previously seen for acute neck pain, diagnosed with torticollis. She was improved with treatment in the ER, but has not been responding to the Ultram. Patient returns with severe torticollis. She had appropriate workup previously, no further workup necessary. Examination is very consistent with torticollis. She has had significant improvement with analgesia and benzodiazepine administration. Will continue as an outpatient.  Final Clinical Impressions(s) / ED Diagnoses   Final diagnoses:  Torticollis, acute    New Prescriptions New Prescriptions   No medications on file  I personally performed the services described in this documentation, which was scribed in my presence. The recorded information has been reviewed and is accurate.    Bonnie Greek, MD 04/30/16 [phone]

## 2016-04-29 NOTE — ED Triage Notes (Signed)
Per EMS, pt from [facility] complains of right sided neck pain. Pt has right sided goiter. Pt was seen Fri for same, pt states pain is radiating to right shoulder. Pt takes tramadol. Pt denies weakness.

## 2016-04-30 DIAGNOSIS — M436 Torticollis: Secondary | ICD-10-CM | POA: Diagnosis not present

## 2016-04-30 LAB — TSH: TSH: 3.19 u[IU]/mL (ref 0.41–5.90)

## 2016-04-30 MED ORDER — DIAZEPAM 5 MG PO TABS: 2.5000 mg | ORAL_TABLET | Freq: Three times a day (TID) | ORAL | 0 refills | Status: DC | PRN

## 2016-04-30 MED ORDER — OXYCODONE-ACETAMINOPHEN 5-325 MG PO TABS: 0.5000 | ORAL_TABLET | Freq: Four times a day (QID) | ORAL | 0 refills | Status: DC | PRN

## 2016-04-30 NOTE — ED Notes (Signed)
[facility] contacted for discharge transport 

## 2016-05-15 LAB — BASIC METABOLIC PANEL
BUN: 54 mg/dL — AB (ref 4–21)
Creatinine: 3.7 mg/dL — AB (ref 0.5–1.1)
Glucose: 90 mg/dL
Potassium: 4.5 mmol/L (ref 3.4–5.3)
Sodium: 140 mmol/L (ref 137–147)

## 2016-05-22 LAB — CBC AND DIFFERENTIAL
HEMATOCRIT: 26 % — AB (ref 36–46)
HEMOGLOBIN: 8.3 g/dL — AB (ref 12.0–16.0)
PLATELETS: 278 10*3/uL (ref 150–399)
WBC: 8.2 10*3/mL

## 2016-05-27 LAB — HEMOGLOBIN A1C: HEMOGLOBIN A1C: 5.3

## 2016-05-27 LAB — LIPID PANEL
Cholesterol: 145 mg/dL (ref 0–200)
HDL: 42 mg/dL (ref 35–70)
LDL Cholesterol: 79 mg/dL
Triglycerides: 121 mg/dL (ref 40–160)

## 2016-05-29 ENCOUNTER — Encounter: Payer: Self-pay | Admitting: Internal Medicine

## 2016-05-29 ENCOUNTER — Non-Acute Institutional Stay (SKILLED_NURSING_FACILITY): Payer: Medicare Other | Admitting: Internal Medicine

## 2016-05-29 DIAGNOSIS — M62838 Other muscle spasm: Secondary | ICD-10-CM

## 2016-05-29 DIAGNOSIS — E042 Nontoxic multinodular goiter: Secondary | ICD-10-CM

## 2016-05-29 DIAGNOSIS — D638 Anemia in other chronic diseases classified elsewhere: Secondary | ICD-10-CM

## 2016-05-29 NOTE — Progress Notes (Signed)
LOCATION: [facility]  PCP: Jilda Panda, MD   Code Status: Full Code  Goals of care: Advanced Directive information Advanced Directives 04/29/2016  Does Patient Have a Medical Advance Directive? No  Would patient like information on creating a medical advance directive? No - Patient declined       Extended Emergency Contact Information Primary Emergency Contact: McCollum,James W Address: [address]          [place], [place] [address] [place] Mobile Phone: [phone] Relation: Son Secondary Emergency Contact: [employer] [place] Address: [address]          [place], [place] [address] Johnnette Litter [place] Phone: [phone] Mobile Phone: [phone] Relation: Relative   Allergies  Allergen Reactions  . Hydrocodone Bitart (Antituss) [Hydrocodone] Other (See Comments)    UNSPECIFIED   . Lyrica [Pregabalin] Other (See Comments)    UNSPECIFIED   . Penicillins Other (See Comments)    UNSPECIFIED  Has patient had a PCN reaction causing immediate rash, facial/tongue/throat swelling, SOB or lightheadedness with hypotension: unknown Has patient had a PCN reaction causing severe rash involving mucus membranes or skin necrosis: unknown Has patient had a PCN reaction that required hospitalization: unknown Has patient had a PCN reaction occurring within the last 10 years: no If all of the above answers are "NO", then may proceed with Cephalosporin use.     Chief Complaint  Patient presents with  . Medical Management of Chronic Issues    Routine Visit      HPI:  Patient is a 76 y.o. female seen today for routine visit. She complains of neck pain to the right side for a month. She has been seen in the ED and treated for torticollis. She was also noted to have multinodular goitre and is pending endocrinology follow up. She is seen in her room today.    Review of Systems:  Constitutional: Negative for fever, chills.  HENT: Negative for headache,  congestion, nasal discharge Eyes: Negative for blurred vision, double vision and discharge. Wears glasses.  Respiratory: Negative for cough, shortness of breath and wheezing.   Cardiovascular: Negative for chest pain, palpitations, leg swelling.  Gastrointestinal: Negative for heartburn, nausea, vomiting, dysphagia, abdominal pain. Had a bowel movement yesterday.  Genitourinary: Negative for dysuria Musculoskeletal: Negative for back pain, fall in the facility. Positive for neck pain right > left. Denies radiation of pain.  Skin: Negative for itching, rash.  Neurological: Negative for dizziness. Psychiatric/Behavioral: Negative for depression    Past Medical History:  Diagnosis Date  . Anemia   . Chronic kidney disease    secondary hyperparathyroidism  . GERD (gastroesophageal reflux disease)   . Gout, unspecified   . Hyperlipidemia   . Hypertension   . Hypothyroid   . Monoclonal gammopathy   . Obesity   . Osteoarthritis   . Secondary hyperparathyroidism [facility])    Past Surgical History:  Procedure Laterality Date  . BACK SURGERY    . COLONOSCOPY W/ POLYPECTOMY    . ORIF ANKLE FRACTURE Right 11/14/2015   Procedure: OPEN REDUCTION INTERNAL FIXATION (ORIF) RIGHT ANKLE FRACTURE;  Surgeon: Renette Butters, MD;  Location: [facility];  Service: Orthopedics;  Laterality: Right;   Social History:   reports that she has never smoked. She has never used smokeless tobacco. She reports that she does not drink alcohol or use drugs.  No family history on file.  Medications: Allergies as of 05/29/2016      Reactions   Hydrocodone Bitart (  antituss) [hydrocodone] Other (See Comments)   UNSPECIFIED    Lyrica [pregabalin] Other (See Comments)   UNSPECIFIED    Penicillins Other (See Comments)   UNSPECIFIED  Has patient had a PCN reaction causing immediate rash, facial/tongue/throat swelling, SOB or lightheadedness with hypotension: unknown Has patient had a PCN reaction causing severe rash  involving mucus membranes or skin necrosis: unknown Has patient had a PCN reaction that required hospitalization: unknown Has patient had a PCN reaction occurring within the last 10 years: no If all of the above answers are "NO", then may proceed with Cephalosporin use.      Medication List       Accurate as of 05/29/16  3:12 PM. Always use your most recent med list.          acetaminophen 325 MG tablet Commonly known as:  TYLENOL Take 650 mg by mouth daily as needed.   acetaminophen 500 MG tablet Commonly known as:  TYLENOL Take 1 tablet (500 mg total) by mouth every 4 (four) hours as needed for mild pain or moderate pain.   allopurinol 100 MG tablet Commonly known as:  ZYLOPRIM Take 100 mg by mouth 2 (two) times daily.   amLODipine 5 MG tablet Commonly known as:  NORVASC Take 5 mg by mouth every evening.   atorvastatin 20 MG tablet Commonly known as:  LIPITOR Take 20 mg by mouth every evening.   benzocaine-menthol 6-10 MG lozenge Commonly known as:  CHLORAEPTIC Take 1 lozenge by mouth as needed for sore throat.   bisacodyl 10 MG suppository Commonly known as:  DULCOLAX Place 10 mg rectally daily as needed for moderate constipation.   Cholecalciferol 50000 units capsule Take 50,000 Units by mouth every 30 (thirty) days.   cloNIDine 0.1 MG tablet Commonly known as:  CATAPRES Take 0.1 mg by mouth as needed. Give if SBP >180 and/or DBP >110 on ESA injection daily only.  If BP within parameters after 30 minutes, give ESA injection.  If BP not within parameters notify MD at Fax [phone]   Darbepoetin Alfa 200 MCG/0.4ML Sosy injection Commonly known as:  ARANESP Inject 200 mcg into the skin every 28 (twenty-eight) days. Per [facility] last dose 10/17/15 (MD aware)   diazepam 5 MG tablet Commonly known as:  VALIUM Take 2.5 mg by mouth 2 (two) times daily as needed for anxiety.   ferrous sulfate 325 (65 FE) MG tablet Take 325 mg by mouth 2 (two) times daily.     furosemide 80 MG tablet Commonly known as:  LASIX Take 80 mg by mouth daily.   hydrALAZINE 100 MG tablet Commonly known as:  APRESOLINE Take 100 mg by mouth 3 (three) times daily.   levothyroxine 75 MCG tablet Commonly known as:  SYNTHROID, LEVOTHROID Take 75 mcg by mouth daily before breakfast.   metoprolol 50 MG tablet Commonly known as:  LOPRESSOR Take 50 mg by mouth 2 (two) times daily.   omeprazole 20 MG capsule Commonly known as:  PRILOSEC Take 20 mg by mouth daily.   polyethylene glycol packet Commonly known as:  MIRALAX / GLYCOLAX Take 17 g by mouth 2 (two) times daily.   sennosides-docusate sodium 8.6-50 MG tablet Commonly known as:  SENOKOT-S Take 2 tablets by mouth 2 (two) times daily.   traMADol 50 MG tablet Commonly known as:  ULTRAM Take 50 mg by mouth every 4 (four) hours as needed for moderate pain or severe pain.       Immunizations: Immunization History  Administered  Date(s) Administered  . Tdap 11/08/2015     Physical Exam:  Vitals:   05/29/16 1440  BP: (!) 147/77  Pulse: 71  Resp: 18  Temp: 97.9 F (36.6 C)  TempSrc: Oral  SpO2: 97%  Weight: 179 lb (81.2 kg)  Height: 5\' 6"  (1.676 m)   Body mass index is 28.89 kg/m.  General- elderly female, overweight, in no acute distress Head- normocephalic, atraumatic Nose- no maxillary or frontal sinus tenderness, no nasal discharge Throat- moist mucus membrane Eyes- PERRLA, EOMI, no pallor, no icterus, no discharge, normal conjunctiva, normal sclera Neck- no cervical lymphadenopathy Cardiovascular- normal s1,s2, no murmur Respiratory- bilateral clear to auscultation, no wheeze, no rhonchi, no crackles, no use of accessory muscles Abdomen- bowel sounds present, soft, non tender Musculoskeletal- able to move all 4 extremities, ROM with her neck ilictis pain to right trapezius area with spasm and some soft tissue swelling on palpation, generalized weakness, on wheelchair Neurological- alert  and oriented to person, place and time Skin- warm and dry Psychiatry- normal mood and affect     Labs reviewed: Basic Metabolic Panel:  Recent Labs  08/25/15 1035 09/22/15 1030 10/20/15 1030 11/14/15 0816  04/17/16 04/26/16 2355 04/27/16 0009 05/15/16  NA 138 139 136 136  < > 141 134*  --  140  K 3.7 4.1 3.6 4.2  < > 3.9 3.4*  --  4.5  CL 103 107 104 99*  --   --  101  --   --   CO2 23 23 23 25   --   --  21*  --   --   GLUCOSE 107* 100* 97 127*  --   --  118*  --   --   BUN 68* 69* 69* 75*  < > 47* 43*  --  54*  CREATININE 5.09* 4.23* 4.40* 4.30*  < > 3.8* 3.33* 3.30* 3.7*  CALCIUM 10.1  9.7 10.5* 10.1 10.0  --   --  10.0  --   --   PHOS 3.6 3.6 3.6  --   --   --   --   --   --   < > = values in this interval not displayed. Liver Function Tests:  Recent Labs  08/25/15 1035 09/22/15 1030 10/20/15 1030 12/04/15  AST  --   --   --  12*  ALT  --   --   --  8  ALKPHOS  --   --   --  64  ALBUMIN 3.6 3.5 3.8  --    No results for input(s): LIPASE, AMYLASE in the last 8760 hours. No results for input(s): AMMONIA in the last 8760 hours. CBC:  Recent Labs  11/14/15 0816  12/25/15 03/04/16  04/24/16 04/26/16 2355 05/22/16  WBC 11.6*  < > 8.5 8.1  < > 6.4 12.9* 8.2  NEUTROABS  --   < > 4 4  --   --  9.2*  --   HGB 10.0*  < > 8.5* 8.2*  < > 9.0* 10.7* 8.3*  HCT 31.2*  < > 27* 26*  < > 29* 32.8* 26*  MCV 92.6  --   --   --   --   --  88.9  --   PLT 324  < > 283 256  < > 321 362 278  < > = values in this interval not displayed. Cardiac Enzymes: No results for input(s): CKTOTAL, CKMB, CKMBINDEX, TROPONINI in the last 8760 hours. BNP: Invalid input(s): POCBNP  CBG: No results for input(s): GLUCAP in the last 8760 hours.  Radiological Exams: No results found.  Assessment/Plan  Trapezius spasm Currently on diazepam 2.5 mg bid prn. Start lidocaine patch on daily basis x 1 week and reassess. Have PT to evaluate for ROM exercise. Will have PMR to evaluate if no  improvement. Continue tylenol 650 mg qhs prn and tylenol 500 mg q4h prn pain with tramadol 50 mg q4h prn pain.     Multinodular goitre Lab Results  Component Value Date   TSH 3.19 04/30/2016   Reviewed tsh. Will need follow up with endocrinology. Continue levothyroxine 75 mcg daily.   Anemia of chronic disease Continue feso4 325 mg bid and aranesp    Family/ staff Communication: reviewed care plan with patient and nursing supervisor    Blanchie Serve, MD Internal Medicine [place], [place] [address] Cell Phone (Monday-Friday 8 am - 5 pm): [phone] On Call: [phone] and follow prompts after 5 pm and on weekends Office Phone: [phone] Office Fax: [phone]

## 2016-06-12 LAB — BASIC METABOLIC PANEL
BUN: 66 mg/dL — AB (ref 4–21)
Creatinine: 4.4 mg/dL — AB (ref 0.5–1.1)
GLUCOSE: 81 mg/dL
POTASSIUM: 4.7 mmol/L (ref 3.4–5.3)
SODIUM: 142 mmol/L (ref 137–147)

## 2016-06-13 ENCOUNTER — Other Ambulatory Visit: Payer: Self-pay | Admitting: Internal Medicine

## 2016-06-13 DIAGNOSIS — Z1231 Encounter for screening mammogram for malignant neoplasm of breast: Secondary | ICD-10-CM

## 2016-06-19 LAB — CBC AND DIFFERENTIAL
HCT: 27 % — AB (ref 36–46)
Hemoglobin: 8.3 g/dL — AB (ref 12.0–16.0)
Platelets: 281 10*3/uL (ref 150–399)
WBC: 7.5 10^3/mL

## 2016-06-25 ENCOUNTER — Encounter: Payer: Self-pay | Admitting: Endocrinology

## 2016-06-25 ENCOUNTER — Ambulatory Visit: Payer: Medicare Other | Admitting: Endocrinology

## 2016-06-25 ENCOUNTER — Ambulatory Visit (INDEPENDENT_AMBULATORY_CARE_PROVIDER_SITE_OTHER): Payer: Medicare Other | Admitting: Endocrinology

## 2016-06-25 VITALS — BP 142/86 | HR 69 | Ht 66.0 in | Wt 179.0 lb

## 2016-06-25 DIAGNOSIS — E042 Nontoxic multinodular goiter: Secondary | ICD-10-CM | POA: Diagnosis not present

## 2016-06-25 DIAGNOSIS — R0600 Dyspnea, unspecified: Secondary | ICD-10-CM | POA: Diagnosis not present

## 2016-06-25 NOTE — Progress Notes (Signed)
Subjective:    Patient ID: Bonnie Alvarez, female    DOB: 07/27/1940, 76 y.o.   MRN: [id number]  HPI Pt is referred by Dr Bubba Camp, for goiter.  Son provides hx, due to pt's frail elderly status.  He says pt's hypothyroidism and goiter were dx'ed in approx 2003.  she has been on prescribed thyroid hormone therapy since then.  she has never taken kelp or any other type of non-prescribed thyroid product.  she has never had thyroid surgery, or XRT to the neck.  she has never been on amiodarone or lithium.  She has slight swelling at the anterior neck, and assoc pain. Past Medical History:  Diagnosis Date  . Anemia   . Chronic kidney disease    secondary hyperparathyroidism  . GERD (gastroesophageal reflux disease)   . Gout, unspecified   . Hyperlipidemia   . Hypertension   . Hypothyroid   . Monoclonal gammopathy   . Obesity   . Osteoarthritis   . Secondary hyperparathyroidism [facility])     Past Surgical History:  Procedure Laterality Date  . BACK SURGERY    . COLONOSCOPY W/ POLYPECTOMY    . ORIF ANKLE FRACTURE Right 11/14/2015   Procedure: OPEN REDUCTION INTERNAL FIXATION (ORIF) RIGHT ANKLE FRACTURE;  Surgeon: Renette Butters, MD;  Location: [facility];  Service: Orthopedics;  Laterality: Right;    Social History   Social History  . Marital status: Widowed    Spouse name: N/A  . Number of children: N/A  . Years of education: N/A   Occupational History  . Not on file.   Social History Main Topics  . Smoking status: Never Smoker  . Smokeless tobacco: Never Used  . Alcohol use No  . Drug use: No  . Sexual activity: No   Other Topics Concern  . Not on file   Social History Narrative  . No narrative on file    Current Outpatient Prescriptions on File Prior to Visit  Medication Sig Dispense Refill  . acetaminophen (TYLENOL) 325 MG tablet Take 650 mg by mouth daily as needed.    Marland Kitchen allopurinol (ZYLOPRIM) 100 MG tablet Take 100 mg by mouth 2 (two) times daily.     Marland Kitchen amLODipine  (NORVASC) 5 MG tablet Take 5 mg by mouth every evening.     Marland Kitchen atorvastatin (LIPITOR) 20 MG tablet Take 20 mg by mouth every evening.     . bisacodyl (DULCOLAX) 10 MG suppository Place 10 mg rectally daily as needed for moderate constipation.    . cloNIDine (CATAPRES) 0.1 MG tablet Take 0.1 mg by mouth as needed. Give if SBP >180 and/or DBP >110 on ESA injection daily only.  If BP within parameters after 30 minutes, give ESA injection.  If BP not within parameters notify MD at Fax [phone]    . Darbepoetin Alfa (ARANESP) 200 MCG/0.4ML SOSY injection Inject 200 mcg into the skin every 28 (twenty-eight) days. Per [facility] last dose 10/17/15 (MD aware)    . ferrous sulfate 325 (65 FE) MG tablet Take 325 mg by mouth 2 (two) times daily.    . furosemide (LASIX) 80 MG tablet Take 80 mg by mouth daily.    . hydrALAZINE (APRESOLINE) 100 MG tablet Take 100 mg by mouth 3 (three) times daily.     Marland Kitchen levothyroxine (SYNTHROID, LEVOTHROID) 75 MCG tablet Take 75 mcg by mouth daily before breakfast.    . metoprolol (LOPRESSOR) 50 MG tablet Take 50 mg by mouth 2 (two) times daily.     Marland Kitchen  omeprazole (PRILOSEC) 20 MG capsule Take 20 mg by mouth daily.    . polyethylene glycol (MIRALAX / GLYCOLAX) packet Take 17 g by mouth 2 (two) times daily.     . sennosides-docusate sodium (SENOKOT-S) 8.6-50 MG tablet Take 2 tablets by mouth 2 (two) times daily.    . traMADol (ULTRAM) 50 MG tablet Take 50 mg by mouth every 4 (four) hours as needed for moderate pain or severe pain.     Marland Kitchen acetaminophen (TYLENOL) 500 MG tablet Take 1 tablet (500 mg total) by mouth every 4 (four) hours as needed for mild pain or moderate pain. (Patient not taking: Reported on 06/25/2016) 30 tablet 0  . Cholecalciferol 50000 units capsule Take 50,000 Units by mouth every 30 (thirty) days.    . diazepam (VALIUM) 5 MG tablet Take 2.5 mg by mouth 2 (two) times daily as needed for anxiety.     No current facility-administered medications on file prior to  visit.     Allergies  Allergen Reactions  . Hydrocodone Bitart (Antituss) [Hydrocodone] Other (See Comments)    UNSPECIFIED   . Lyrica [Pregabalin] Other (See Comments)    UNSPECIFIED   . Penicillins Other (See Comments)    UNSPECIFIED  Has patient had a PCN reaction causing immediate rash, facial/tongue/throat swelling, SOB or lightheadedness with hypotension: unknown Has patient had a PCN reaction causing severe rash involving mucus membranes or skin necrosis: unknown Has patient had a PCN reaction that required hospitalization: unknown Has patient had a PCN reaction occurring within the last 10 years: no If all of the above answers are "NO", then may proceed with Cephalosporin use.     Family History  Problem Relation Age of Onset  . Thyroid disease Neg Hx     BP (!) 142/86   Pulse 69   Ht 5\' 6"  (1.676 m)   Wt 179 lb (81.2 kg)   SpO2 97%   BMI 28.89 kg/m    Review of Systems Denies weight change, hoarseness, visual loss, chest pain, sob, cough, dysphagia, diarrhea, itching, flushing, easy bruising, depression, cold intolerance, headache, numbness, and rhinorrhea.       Objective:   Physical Exam VS: see vs page GEN: no distress.  In wheelchair.  HEAD: head: no deformity eyes: no periorbital swelling.  Slight bilateral proptosis.   external nose and ears are normal mouth: no lesion seen NECK: I can feel the top of the goiter, but can't tell details.  CHEST WALL: no deformity.   LUNGS: clear to auscultation CV: reg rate and rhythm, no murmur ABD: abdomen is soft, nontender.  no hepatosplenomegaly.  not distended.  no hernia MUSCULOSKELETAL: muscle bulk and strength are grossly normal.  no obvious joint swelling.  gait is normal and steady EXTEMITIES: no deformity.  1+ bilat leg edema PULSES: no carotid bruit NEURO:  cn 2-12 grossly intact.   readily moves all 4's.  sensation is intact to touch on all 4's SKIN:  Normal texture and temperature.  No rash or  suspicious lesion is visible.   NODES:  None palpable at the neck.   PSYCH: alert, well-oriented.  Does not appear anxious nor depressed.     Lab Results  Component Value Date   TSH 3.19 04/30/2016    I personally reviewed spirometry tracing (today): Indication: goiter Impression: no tracheal obstruction No comparison is available.       Assessment & Plan:  Large goiter, new to me.  We discussed. She declines bx, so we'll follow [place].  Hypothyroidism: well-replaced  Patient Instructions  Please continue the same thyroid medication. Let's check an ultrasound.  you will receive a phone call, about a day and time for an appointment. Please come back for a follow-up appointment in 6 months.

## 2016-06-25 NOTE — Patient Instructions (Addendum)
Please continue the same thyroid medication. Let's check an ultrasound.  you will receive a phone call, about a day and time for an appointment. Please come back for a follow-up appointment in 6 months.

## 2016-06-28 ENCOUNTER — Ambulatory Visit: Payer: Medicare Other | Admitting: Endocrinology

## 2016-07-02 ENCOUNTER — Ambulatory Visit
Admission: RE | Admit: 2016-07-02 | Discharge: 2016-07-02 | Disposition: A | Discharge status: Home / Self Care | Payer: Medicare Other | Source: Ambulatory Visit | Attending: Internal Medicine | Admitting: Internal Medicine

## 2016-07-02 DIAGNOSIS — Z1231 Encounter for screening mammogram for malignant neoplasm of breast: Secondary | ICD-10-CM

## 2016-07-08 ENCOUNTER — Ambulatory Visit
Admission: RE | Admit: 2016-07-08 | Discharge: 2016-07-08 | Disposition: A | Discharge status: Home / Self Care | Payer: Medicare Other | Source: Ambulatory Visit | Attending: Endocrinology | Admitting: Endocrinology

## 2016-07-08 DIAGNOSIS — E042 Nontoxic multinodular goiter: Secondary | ICD-10-CM

## 2016-07-09 ENCOUNTER — Non-Acute Institutional Stay (SKILLED_NURSING_FACILITY): Payer: Medicare Other | Admitting: Internal Medicine

## 2016-07-09 ENCOUNTER — Encounter: Payer: Self-pay | Admitting: Internal Medicine

## 2016-07-09 DIAGNOSIS — K5909 Other constipation: Secondary | ICD-10-CM | POA: Diagnosis not present

## 2016-07-09 DIAGNOSIS — E040 Nontoxic diffuse goiter: Secondary | ICD-10-CM | POA: Diagnosis not present

## 2016-07-09 DIAGNOSIS — I10 Essential (primary) hypertension: Secondary | ICD-10-CM | POA: Diagnosis not present

## 2016-07-09 NOTE — Progress Notes (Signed)
LOCATION: [facility]  PCP: Jilda Panda, MD   Code Status: Full Code  Goals of care: Advanced Directive information Advanced Directives 04/29/2016  Does Patient Have a Medical Advance Directive? No  Would patient like information on creating a medical advance directive? No - Patient declined       Extended Emergency Contact Information Primary Emergency Contact: McCollum,James W Address: [address]          [place], [place] [address] [place] Mobile Phone: [phone] Relation: Son Secondary Emergency Contact: [facility] Address: [address]          [place], [place] [address] [place] Phone: [phone] Mobile Phone: [phone] Relation: Relative   Allergies  Allergen Reactions  . Hydrocodone Bitart (Antituss) [Hydrocodone] Other (See Comments)    UNSPECIFIED   . Lyrica [Pregabalin] Other (See Comments)    UNSPECIFIED   . Penicillins Other (See Comments)    UNSPECIFIED  Has patient had a PCN reaction causing immediate rash, facial/tongue/throat swelling, SOB or lightheadedness with hypotension: unknown Has patient had a PCN reaction causing severe rash involving mucus membranes or skin necrosis: unknown Has patient had a PCN reaction that required hospitalization: unknown Has patient had a PCN reaction occurring within the last 10 years: no If all of the above answers are "NO", then may proceed with Cephalosporin use.     Chief Complaint  Patient presents with  . Medical Management of Chronic Issues    Routine Visit     HPI:  Patient is a 76 y.o. female seen today for routine visit. She appears comfortable and denies any concern this visit.  No new concerns from nursing staff. She has been seen by endocrinology for her goiter and had ultrasound yesterday.    Review of Systems:  Constitutional: Negative for fever, chills.  HENT: Negative for headache, congestion Eyes: Negative for blurred vision, double  vision and discharge. Wears glasses.  Respiratory: Negative for cough, shortness of breath and wheezing.   Cardiovascular: Negative for chest pain, palpitations, leg swelling.  Gastrointestinal: Negative for heartburn, nausea, vomiting, dysphagia, abdominal pain. Had a bowel movement yesterday.  Genitourinary: Negative for dysuria Musculoskeletal: Negative for back pain, fall in the facility.  Skin: Negative for itching, rash.  Neurological: Negative for dizziness. Psychiatric/Behavioral: Negative for depression    Past Medical History:  Diagnosis Date  . Anemia   . Chronic kidney disease    secondary hyperparathyroidism  . GERD (gastroesophageal reflux disease)   . Gout, unspecified   . Hyperlipidemia   . Hypertension   . Hypothyroid   . Monoclonal gammopathy   . Obesity   . Osteoarthritis   . Secondary hyperparathyroidism [facility])    Past Surgical History:  Procedure Laterality Date  . BACK SURGERY    . COLONOSCOPY W/ POLYPECTOMY    . ORIF ANKLE FRACTURE Right 11/14/2015   Procedure: OPEN REDUCTION INTERNAL FIXATION (ORIF) RIGHT ANKLE FRACTURE;  Surgeon: Renette Butters, MD;  Location: [facility];  Service: Orthopedics;  Laterality: Right;   Social History:   reports that she has never smoked. She has never used smokeless tobacco. She reports that she does not drink alcohol or use drugs.  Family History  Problem Relation Age of Onset  . Thyroid disease Neg Hx   . Breast cancer Neg Hx     Medications: Allergies as of 07/09/2016      Reactions   Hydrocodone Bitart (antituss) [hydrocodone] Other (See Comments)   UNSPECIFIED  Lyrica [pregabalin] Other (See Comments)   UNSPECIFIED    Penicillins Other (See Comments)   UNSPECIFIED  Has patient had a PCN reaction causing immediate rash, facial/tongue/throat swelling, SOB or lightheadedness with hypotension: unknown Has patient had a PCN reaction causing severe rash involving mucus membranes or skin necrosis: unknown Has  patient had a PCN reaction that required hospitalization: unknown Has patient had a PCN reaction occurring within the last 10 years: no If all of the above answers are "NO", then may proceed with Cephalosporin use.      Medication List       Accurate as of 07/09/16  3:15 PM. Always use your most recent med list.          acetaminophen 325 MG tablet Commonly known as:  TYLENOL Take 650 mg by mouth every 4 (four) hours as needed.   acetaminophen 500 MG tablet Commonly known as:  TYLENOL Take 1 tablet (500 mg total) by mouth every 4 (four) hours as needed for mild pain or moderate pain.   allopurinol 100 MG tablet Commonly known as:  ZYLOPRIM Take 100 mg by mouth 2 (two) times daily.   amLODipine 5 MG tablet Commonly known as:  NORVASC Take 5 mg by mouth every evening.   atorvastatin 20 MG tablet Commonly known as:  LIPITOR Take 20 mg by mouth every evening.   bisacodyl 10 MG suppository Commonly known as:  DULCOLAX Place 10 mg rectally daily as needed for moderate constipation.   Cholecalciferol 50000 units capsule Take 50,000 Units by mouth every 30 (thirty) days.   cloNIDine 0.1 MG tablet Commonly known as:  CATAPRES Take 0.1 mg by mouth as needed. Give if SBP >180 and/or DBP >110 on ESA injection daily only.  If BP within parameters after 30 minutes, give ESA injection.  If BP not within parameters notify MD at Fax [phone]   Darbepoetin Alfa 200 MCG/0.4ML Sosy injection Commonly known as:  ARANESP Inject 200 mcg into the skin every 28 (twenty-eight) days. Per [facility] last dose 10/17/15 (MD aware)   ferrous sulfate 325 (65 FE) MG tablet Take 325 mg by mouth 2 (two) times daily.   furosemide 80 MG tablet Commonly known as:  LASIX Take 80 mg by mouth daily.   hydrALAZINE 100 MG tablet Commonly known as:  APRESOLINE Take 100 mg by mouth 3 (three) times daily.   levothyroxine 75 MCG tablet Commonly known as:  SYNTHROID, LEVOTHROID Take 75 mcg by mouth  daily before breakfast.   lidocaine 5 % Commonly known as:  LIDODERM Place 1 patch onto the skin daily. Remove & Discard patch within 12 hours or as directed by MD   loperamide 2 MG capsule Commonly known as:  IMODIUM Take 2 mg by mouth as needed for diarrhea or loose stools.   metoprolol 50 MG tablet Commonly known as:  LOPRESSOR Take 50 mg by mouth 2 (two) times daily.   omeprazole 20 MG capsule Commonly known as:  PRILOSEC Take 20 mg by mouth daily.   phenol 1.4 % Liqd Commonly known as:  CHLORASEPTIC Use as directed 1 spray in the mouth or throat as needed for throat irritation / pain.   polyethylene glycol packet Commonly known as:  MIRALAX / GLYCOLAX Take 17 g by mouth 2 (two) times daily.   sennosides-docusate sodium 8.6-50 MG tablet Commonly known as:  SENOKOT-S Take 2 tablets by mouth 2 (two) times daily.   traMADol 50 MG tablet Commonly known as:  ULTRAM Take 50  mg by mouth every 4 (four) hours as needed for moderate pain or severe pain.       Immunizations: Immunization History  Administered Date(s) Administered  . Tdap 11/08/2015     Physical Exam:  Vitals:   07/09/16 1502  BP: 127/75  Pulse: 73  Resp: 18  Temp: 97.5 F (36.4 C)  TempSrc: Oral  SpO2: 98%  Weight: 181 lb (82.1 kg)  Height: 5\' 6"  (1.676 m)   Body mass index is 29.21 kg/m.  General- elderly female, overweight, in no acute distress Head- normocephalic, atraumatic Nose-  no nasal discharge Throat- moist mucus membrane Eyes- PERRLA, EOMI, no pallor, no icterus, no discharge Neck- no cervical lymphadenopathy, goitre present Cardiovascular- normal s1,s2, no murmur Respiratory- bilateral clear to auscultation, no wheeze, no rhonchi, no crackles, no use of accessory muscles Abdomen- bowel sounds present, soft, non tender Musculoskeletal- able to move all 4 extremities, on wheelchair, trace leg edema Neurological- alert and oriented to person, place and time Skin- warm and  dry Psychiatry- normal mood and affect     Labs reviewed: Basic Metabolic Panel:  Recent Labs  08/25/15 1035 09/22/15 1030 10/20/15 1030 11/14/15 0816  04/26/16 2355 04/27/16 0009 05/15/16 06/12/16  NA 138 139 136 136  < > 134*  --  140 142  K 3.7 4.1 3.6 4.2  < > 3.4*  --  4.5 4.7  CL 103 107 104 99*  --  101  --   --   --   CO2 23 23 23 25   --  21*  --   --   --   GLUCOSE 107* 100* 97 127*  --  118*  --   --   --   BUN 68* 69* 69* 75*  < > 43*  --  54* 66*  CREATININE 5.09* 4.23* 4.40* 4.30*  < > 3.33* 3.30* 3.7* 4.4*  CALCIUM 10.1  9.7 10.5* 10.1 10.0  --  10.0  --   --   --   PHOS 3.6 3.6 3.6  --   --   --   --   --   --   < > = values in this interval not displayed. Liver Function Tests:  Recent Labs  08/25/15 1035 09/22/15 1030 10/20/15 1030 12/04/15  AST  --   --   --  12*  ALT  --   --   --  8  ALKPHOS  --   --   --  64  ALBUMIN 3.6 3.5 3.8  --    No results for input(s): LIPASE, AMYLASE in the last 8760 hours. No results for input(s): AMMONIA in the last 8760 hours. CBC:  Recent Labs  11/14/15 0816  12/25/15 03/04/16  04/26/16 2355 05/22/16 06/19/16  WBC 11.6*  < > 8.5 8.1  < > 12.9* 8.2 7.5  NEUTROABS  --   < > 4 4  --  9.2*  --   --   HGB 10.0*  < > 8.5* 8.2*  < > 10.7* 8.3* 8.3*  HCT 31.2*  < > 27* 26*  < > 32.8* 26* 27*  MCV 92.6  --   --   --   --  88.9  --   --   PLT 324  < > 283 256  < > 362 278 281  < > = values in this interval not displayed. Cardiac Enzymes: No results for input(s): CKTOTAL, CKMB, CKMBINDEX, TROPONINI in the last 8760 hours. BNP: Invalid input(s): POCBNP  CBG: No results for input(s): GLUCAP in the last 8760 hours.  Radiological Exams: US Soft Tissue Head And Neck  Result Date: 07/09/2016 CLINICAL DATA:  Incidental on CT. Gland was noted to be enlarged on CT EXAM: THYROID ULTRASOUND TECHNIQUE: Ultrasound examination of the thyroid gland and adjacent soft tissues was performed. COMPARISON:  None. FINDINGS: Parenchymal  Echotexture: Markedly heterogenous Isthmus: 0.6 cm Right lobe: 7.4 x 3.2 x 4.1 cm Left lobe: 10.4 x 3.7 x 4.0 cm _________________________________________________________ Estimated total number of nodules >/= 1 cm: 0 Number of spongiform nodules >/=  2 cm not described below (TR1): 0 Number of mixed cystic and solid nodules >/= 1.5 cm not described below (TR2): 0 _________________________________________________________ The gland is markedly heterogeneous. Several focal areas have been measured by the technologist, however these are all pseudo nodules and blend in to adjacent tissue with visualization. IMPRESSION: The gland is markedly heterogeneous and enlarged without definite focal nodule. The above is in keeping with the ACR TI-RADS recommendations - J Am Coll Radiol 2017;14:587-595. Electronically Signed   By: Marybelle Killings M.D.   On: 07/09/2016 10:54   Mm Screening Breast Tomo Bilateral  Result Date: 07/03/2016 CLINICAL DATA:  Screening. EXAM: 2D DIGITAL SCREENING BILATERAL MAMMOGRAM WITH CAD AND ADJUNCT TOMO COMPARISON:  Previous exam(s). ACR Breast Density Category b: There are scattered areas of fibroglandular density. FINDINGS: There are no findings suspicious for malignancy. Images were processed with CAD. IMPRESSION: No mammographic evidence of malignancy. A result letter of this screening mammogram will be mailed directly to the patient. RECOMMENDATION: Screening mammogram in one year. (Code:SM-B-01Y) BI-RADS CATEGORY  1: Negative. Electronically Signed   By: Nolon Nations M.D.   On: 07/03/2016 08:06    Assessment/Plan  goitre Lab Results  Component Value Date   TSH 3.19 04/30/2016   Continue levothyroxine 75 mcg daily. Reviewed ultrasound of thyroid gland showing enlarged thyroid gland without focal nodule. f/u with endocrinology.    HTN  stable blood pressure readings on review. Continue norvasc 5 mg daily, clonidine 0.1 mg daily and hydralazine 100 mg 3 times a day with Lopressor  50 mg tice a day.  Chronic constipation Stable with stool softner and laxative, monitor clinically, no changes made  Family/ staff Communication: reviewed care plan with patient and nursing supervisor    Blanchie Serve, MD Internal Medicine [place], [place] [address] Cell Phone (Monday-Friday 8 am - 5 pm): [phone] On Call: [phone] and follow prompts after 5 pm and on weekends Office Phone: [phone] Office Fax: [phone]

## 2016-08-06 ENCOUNTER — Non-Acute Institutional Stay (SKILLED_NURSING_FACILITY): Payer: Medicare Other

## 2016-08-06 DIAGNOSIS — Z0000 Encounter for general adult medical examination without abnormal findings: Secondary | ICD-10-CM

## 2016-08-06 NOTE — Progress Notes (Signed)
Quick Notes   Health Maintenance: PNA 13 and flu ordered. DEXA due, order if needed.     Abnormal Screen: 6 CIT-12     Patient Concerns: None     Nurse Concerns: None

## 2016-08-06 NOTE — Patient Instructions (Signed)
[name] , Thank you for taking time to come for your Medicare Wellness Visit. I appreciate your ongoing commitment to your health goals. Please review the following plan we discussed and let me know if I can assist you in the future.   Screening recommendations/referrals: Colonoscopy pt over 71 Mammogram pt over 75 Bone Density due Recommended yearly ophthalmology/optometry visit for glaucoma screening and checkup Recommended yearly dental visit for hygiene and checkup  Vaccinations: Influenza vaccine ordered Pneumococcal vaccine 13 ordered Tdap vaccine not in records Shingles vaccine not in records  Advanced directives: Need copy in chart  Conditions/risks identified: none  Next appointment: none upcoming   Preventive Care 72 Years and Older, Female Preventive care refers to lifestyle choices and visits with your health care provider that can promote health and wellness. What does preventive care include?  A yearly physical exam. This is also called an annual well check.  Dental exams once or twice a year.  Routine eye exams. Ask your health care provider how often you should have your eyes checked.  Personal lifestyle choices, including:  Daily care of your teeth and gums.  Regular physical activity.  Eating a healthy diet.  Avoiding tobacco and drug use.  Limiting alcohol use.  Practicing safe sex.  Taking low-dose aspirin every day.  Taking vitamin and mineral supplements as recommended by your health care provider. What happens during an annual well check? The services and screenings done by your health care provider during your annual well check will depend on your age, overall health, lifestyle risk factors, and family history of disease. Counseling  Your health care provider may ask you questions about your:  Alcohol use.  Tobacco use.  Drug use.  Emotional well-being.  Home and relationship well-being.  Sexual activity.  Eating  habits.  History of falls.  Memory and ability to understand (cognition).  Work and work Statistician.  Reproductive health. Screening  You may have the following tests or measurements:  Height, weight, and BMI.  Blood pressure.  Lipid and cholesterol levels. These may be checked every 5 years, or more frequently if you are over 96 years old.  Skin check.  Lung cancer screening. You may have this screening every year starting at age 109 if you have a 30-pack-year history of smoking and currently smoke or have quit within the past 15 years.  Fecal occult blood test (FOBT) of the stool. You may have this test every year starting at age 82.  Flexible sigmoidoscopy or colonoscopy. You may have a sigmoidoscopy every 5 years or a colonoscopy every 10 years starting at age 40.  Hepatitis C blood test.  Hepatitis B blood test.  Sexually transmitted disease (STD) testing.  Diabetes screening. This is done by checking your blood sugar (glucose) after you have not eaten for a while (fasting). You may have this done every 1-3 years.  Bone density scan. This is done to screen for osteoporosis. You may have this done starting at age 44.  Mammogram. This may be done every 1-2 years. Talk to your health care provider about how often you should have regular mammograms. Talk with your health care provider about your test results, treatment options, and if necessary, the need for more tests. Vaccines  Your health care provider may recommend certain vaccines, such as:  Influenza vaccine. This is recommended every year.  Tetanus, diphtheria, and acellular pertussis (Tdap, Td) vaccine. You may need a Td booster every 10 years.  Zoster vaccine. You may need  this after age 44.  Pneumococcal 13-valent conjugate (PCV13) vaccine. One dose is recommended after age 69.  Pneumococcal polysaccharide (PPSV23) vaccine. One dose is recommended after age 74. Talk to your health care provider about which  screenings and vaccines you need and how often you need them. This information is not intended to replace advice given to you by your health care provider. Make sure you discuss any questions you have with your health care provider. Document Released: 03/31/2015 Document Revised: 11/22/2015 Document Reviewed: 01/03/2015 Elsevier Interactive Patient Education  2017 [place] Prevention in the Home Falls can cause injuries. They can happen to people of all ages. There are many things you can do to make your home safe and to help prevent falls. What can I do on the outside of my home?  Regularly fix the edges of walkways and driveways and fix any cracks.  Remove anything that might make you trip as you walk through a door, such as a raised step or threshold.  Trim any bushes or trees on the path to your home.  Use bright outdoor lighting.  Clear any walking paths of anything that might make someone trip, such as rocks or tools.  Regularly check to see if handrails are loose or broken. Make sure that both sides of any steps have handrails.  Any raised decks and porches should have guardrails on the edges.  Have any leaves, snow, or ice cleared regularly.  Use sand or salt on walking paths during winter.  Clean up any spills in your garage right away. This includes oil or grease spills. What can I do in the bathroom?  Use night lights.  Install grab bars by the toilet and in the tub and shower. Do not use towel bars as grab bars.  Use non-skid mats or decals in the tub or shower.  If you need to sit down in the shower, use a plastic, non-slip stool.  Keep the floor dry. Clean up any water that spills on the floor as soon as it happens.  Remove soap buildup in the tub or shower regularly.  Attach bath mats securely with double-sided non-slip rug tape.  Do not have throw rugs and other things on the floor that can make you trip. What can I do in the bedroom?  Use  night lights.  Make sure that you have a light by your bed that is easy to reach.  Do not use any sheets or blankets that are too big for your bed. They should not hang down onto the floor.  Have a firm chair that has side arms. You can use this for support while you get dressed.  Do not have throw rugs and other things on the floor that can make you trip. What can I do in the kitchen?  Clean up any spills right away.  Avoid walking on wet floors.  Keep items that you use a lot in easy-to-reach places.  If you need to reach something above you, use a strong step stool that has a grab bar.  Keep electrical cords out of the way.  Do not use floor polish or wax that makes floors slippery. If you must use wax, use non-skid floor wax.  Do not have throw rugs and other things on the floor that can make you trip. What can I do with my stairs?  Do not leave any items on the stairs.  Make sure that there are handrails on both sides of  the stairs and use them. Fix handrails that are broken or loose. Make sure that handrails are as long as the stairways.  Check any carpeting to make sure that it is firmly attached to the stairs. Fix any carpet that is loose or worn.  Avoid having throw rugs at the top or bottom of the stairs. If you do have throw rugs, attach them to the floor with carpet tape.  Make sure that you have a light switch at the top of the stairs and the bottom of the stairs. If you do not have them, ask someone to add them for you. What else can I do to help prevent falls?  Wear shoes that:  Do not have high heels.  Have rubber bottoms.  Are comfortable and fit you well.  Are closed at the toe. Do not wear sandals.  If you use a stepladder:  Make sure that it is fully opened. Do not climb a closed stepladder.  Make sure that both sides of the stepladder are locked into place.  Ask someone to hold it for you, if possible.  Clearly mark and make sure that you  can see:  Any grab bars or handrails.  First and last steps.  Where the edge of each step is.  Use tools that help you move around (mobility aids) if they are needed. These include:  Canes.  Walkers.  Scooters.  Crutches.  Turn on the lights when you go into a dark area. Replace any light bulbs as soon as they burn out.  Set up your furniture so you have a clear path. Avoid moving your furniture around.  If any of your floors are uneven, fix them.  If there are any pets around you, be aware of where they are.  Review your medicines with your doctor. Some medicines can make you feel dizzy. This can increase your chance of falling. Ask your doctor what other things that you can do to help prevent falls. This information is not intended to replace advice given to you by your health care provider. Make sure you discuss any questions you have with your health care provider. Document Released: 12/29/2008 Document Revised: 08/10/2015 Document Reviewed: 04/08/2014 Elsevier Interactive Patient Education  2017 [employer].

## 2016-08-06 NOTE — Progress Notes (Signed)
Subjective:   Bonnie Alvarez is a 76 y.o. female who presents for an Initial Medicare Annual Wellness Visit at [facility]     Objective:    Today's Vitals   08/06/16 1536  BP: (!) 162/80  Pulse: 68  Temp: 97.1 F (36.2 C)  TempSrc: Oral  SpO2: 97%  Weight: 181 lb (82.1 kg)  Height: 5\' 6"  (1.676 m)   Body mass index is 29.21 kg/m.   Current Medications (verified) Outpatient Encounter Prescriptions as of 08/06/2016  Medication Sig  . acetaminophen (TYLENOL) 325 MG tablet Take 650 mg by mouth every 4 (four) hours as needed.   Marland Kitchen acetaminophen (TYLENOL) 500 MG tablet Take 1 tablet (500 mg total) by mouth every 4 (four) hours as needed for mild pain or moderate pain.  Marland Kitchen allopurinol (ZYLOPRIM) 100 MG tablet Take 100 mg by mouth 2 (two) times daily.   Marland Kitchen amLODipine (NORVASC) 5 MG tablet Take 5 mg by mouth every evening.   Marland Kitchen atorvastatin (LIPITOR) 20 MG tablet Take 20 mg by mouth every evening.   . bisacodyl (DULCOLAX) 10 MG suppository Place 10 mg rectally daily as needed for moderate constipation.  . Cholecalciferol 50000 units capsule Take 50,000 Units by mouth every 30 (thirty) days.  . cloNIDine (CATAPRES) 0.1 MG tablet Take 0.1 mg by mouth as needed. Give if SBP >180 and/or DBP >110 on ESA injection daily only.  If BP within parameters after 30 minutes, give ESA injection.  If BP not within parameters notify MD at Fax [phone]  . Darbepoetin Alfa (ARANESP) 200 MCG/0.4ML SOSY injection Inject 200 mcg into the skin every 28 (twenty-eight) days. Per [facility] last dose 10/17/15 (MD aware)  . ferrous sulfate 325 (65 FE) MG tablet Take 325 mg by mouth 2 (two) times daily.  . furosemide (LASIX) 80 MG tablet Take 80 mg by mouth daily.  . hydrALAZINE (APRESOLINE) 100 MG tablet Take 100 mg by mouth 3 (three) times daily.   Marland Kitchen levothyroxine (SYNTHROID, LEVOTHROID) 75 MCG tablet Take 75 mcg by mouth daily before breakfast.  . lidocaine (LIDODERM) 5 % Place 1 patch onto  the skin daily. Remove & Discard patch within 12 hours or as directed by MD  . loperamide (IMODIUM) 2 MG capsule Take 2 mg by mouth as needed for diarrhea or loose stools.  . metoprolol (LOPRESSOR) 50 MG tablet Take 50 mg by mouth 2 (two) times daily.   Marland Kitchen omeprazole (PRILOSEC) 20 MG capsule Take 20 mg by mouth daily.  . phenol (CHLORASEPTIC) 1.4 % LIQD Use as directed 1 spray in the mouth or throat as needed for throat irritation / pain.  . polyethylene glycol (MIRALAX / GLYCOLAX) packet Take 17 g by mouth 2 (two) times daily.   . sennosides-docusate sodium (SENOKOT-S) 8.6-50 MG tablet Take 2 tablets by mouth 2 (two) times daily.  . traMADol (ULTRAM) 50 MG tablet Take 50 mg by mouth every 4 (four) hours as needed for moderate pain or severe pain.    No facility-administered encounter medications on file as of 08/06/2016.     Allergies (verified) Hydrocodone bitart (antituss) [hydrocodone]; Lyrica [pregabalin]; and Penicillins   History: Past Medical History:  Diagnosis Date  . Anemia   . Chronic kidney disease    secondary hyperparathyroidism  . GERD (gastroesophageal reflux disease)   . Gout, unspecified   . Hyperlipidemia   . Hypertension   . Hypothyroid   . Monoclonal gammopathy   . Obesity   . Osteoarthritis   .  Secondary hyperparathyroidism [facility])    Past Surgical History:  Procedure Laterality Date  . BACK SURGERY    . COLONOSCOPY W/ POLYPECTOMY    . ORIF ANKLE FRACTURE Right 11/14/2015   Procedure: OPEN REDUCTION INTERNAL FIXATION (ORIF) RIGHT ANKLE FRACTURE;  Surgeon: Renette Butters, MD;  Location: [place];  Service: Orthopedics;  Laterality: Right;   Family History  Problem Relation Age of Onset  . Thyroid disease Neg Hx   . Breast cancer Neg Hx    Social History   Occupational History  . Not on file.   Social History Main Topics  . Smoking status: Former Smoker    Packs/day: 0.25    Years: 2.00  . Smokeless tobacco: Never Used  . Alcohol use No  . Drug  use: No  . Sexual activity: No    Tobacco Counseling Counseling given: Not Answered   Activities of Daily Living In your present state of health, do you have any difficulty performing the following activities: 08/06/2016 11/14/2015  Hearing? N N  Vision? N N  Difficulty concentrating or making decisions? N N  Walking or climbing stairs? Y Y  Dressing or bathing? Y Y  Doing errands, shopping? Y -  Conservation officer, nature and eating ? Y -  Using the Toilet? Y -  In the past six months, have you accidently leaked urine? N -  Do you have problems with loss of bowel control? N -  Managing your Medications? Y -  Managing your Finances? Y -  Housekeeping or managing your Housekeeping? Y -  Some recent data might be hidden    Immunizations and Health Maintenance Immunization History  Administered Date(s) Administered  . Tdap 11/08/2015   There are no preventive care reminders to display for this patient.  Patient Care Team: Jilda Panda, MD as PCP - General (Internal Medicine)  Indicate any recent Medical Services you may have received from other than [facility] providers in the past year (date may be approximate).     Assessment:   This is a routine wellness examination for Bonnie Alvarez.   Hearing/Vision screen No exam data present  Dietary issues and exercise activities discussed: Current Exercise Habits: The patient does not participate in regular exercise at present, Exercise limited by: None identified  Goals    . Maintain Lifestyle          Starting today pt will maintain lifestyle.       Depression Screen PHQ 2/9 Scores 08/06/2016  PHQ - 2 Score 0    Fall Risk Fall Risk  08/06/2016  Falls in the past year? Yes  Number falls in past yr: 2 or more  Injury with Fall? No    Cognitive Function:     6CIT Screen 08/06/2016  What Year? 4 points  What month? 0 points  What time? 0 points  Count back from 20 2 points  Months in reverse 4 points  Repeat phrase 2 points    Total Score 12    Screening Tests Health Maintenance  Topic Date Due  . INFLUENZA VACCINE  03/18/2017 (Originally 10/16/2016)  . DEXA SCAN  03/18/2017 (Originally 04/24/2003)  . PNA vac Low Risk Adult (1 of 2 - PCV13) 03/18/2017 (Originally 04/24/2003)  . TETANUS/TDAP  11/07/2025      Plan:    I have personally reviewed and addressed the Medicare Annual Wellness questionnaire and have noted the following in the patient's chart:  A. Medical and social history B. Use of alcohol, tobacco or illicit drugs  C. Current medications and supplements D. Functional ability and status E.  Nutritional status F.  Physical activity G. Advance directives H. List of other physicians I.  Hospitalizations, surgeries, and ER visits in previous 12 months J.  [place] to include hearing, vision, cognitive, depression L. Referrals and appointments - none  In addition, I have reviewed and discussed with patient certain preventive protocols, quality metrics, and best practice recommendations. A written personalized care plan for preventive services as well as general preventive health recommendations were provided to patient.  See attached scanned questionnaire for additional information.   Signed,   Rich Reining, RN Nurse Health Advisor

## 2016-08-07 ENCOUNTER — Encounter: Payer: Self-pay | Admitting: Internal Medicine

## 2016-08-07 NOTE — Progress Notes (Signed)
This encounter was created in error - please disregard.

## 2016-11-28 ENCOUNTER — Other Ambulatory Visit (HOSPITAL_COMMUNITY): Payer: Self-pay | Admitting: *Deleted

## 2016-11-29 ENCOUNTER — Ambulatory Visit (HOSPITAL_COMMUNITY): Admission: RE | Admit: 2016-11-29 | Payer: Medicare Other | Source: Ambulatory Visit

## 2016-12-03 ENCOUNTER — Ambulatory Visit (HOSPITAL_COMMUNITY)
Admission: RE | Admit: 2016-12-03 | Discharge: 2016-12-03 | Disposition: A | Discharge status: Home / Self Care | Payer: Medicare Other | Source: Ambulatory Visit | Attending: Nephrology | Admitting: Nephrology

## 2016-12-03 DIAGNOSIS — D638 Anemia in other chronic diseases classified elsewhere: Secondary | ICD-10-CM | POA: Insufficient documentation

## 2016-12-03 DIAGNOSIS — N185 Chronic kidney disease, stage 5: Secondary | ICD-10-CM | POA: Insufficient documentation

## 2016-12-03 MED ORDER — SODIUM CHLORIDE 0.9 % IV SOLN
510.0000 mg | INTRAVENOUS | Status: DC
  Administered 2016-12-03: 12:00:00 510 mg via INTRAVENOUS
  Filled 2016-12-03: qty 17

## 2016-12-04 ENCOUNTER — Encounter (HOSPITAL_COMMUNITY): Payer: Medicare Other

## 2016-12-10 ENCOUNTER — Ambulatory Visit (HOSPITAL_COMMUNITY)
Admission: RE | Admit: 2016-12-10 | Discharge: 2016-12-10 | Disposition: A | Discharge status: Home / Self Care | Payer: Medicare Other | Source: Ambulatory Visit | Attending: Nephrology | Admitting: Nephrology

## 2016-12-10 DIAGNOSIS — D631 Anemia in chronic kidney disease: Secondary | ICD-10-CM | POA: Insufficient documentation

## 2016-12-10 DIAGNOSIS — N189 Chronic kidney disease, unspecified: Secondary | ICD-10-CM | POA: Insufficient documentation

## 2016-12-10 MED ORDER — SODIUM CHLORIDE 0.9 % IV SOLN
510.0000 mg | INTRAVENOUS | Status: AC
  Administered 2016-12-10: 11:00:00 510 mg via INTRAVENOUS
  Filled 2016-12-10: qty 17

## 2016-12-25 ENCOUNTER — Encounter: Payer: Self-pay | Admitting: Endocrinology

## 2016-12-25 ENCOUNTER — Ambulatory Visit (INDEPENDENT_AMBULATORY_CARE_PROVIDER_SITE_OTHER): Payer: Medicare Other | Admitting: Endocrinology

## 2016-12-25 DIAGNOSIS — E039 Hypothyroidism, unspecified: Secondary | ICD-10-CM | POA: Diagnosis not present

## 2016-12-25 LAB — TSH: TSH: 4.3 u[IU]/mL (ref 0.35–4.50)

## 2016-12-25 NOTE — Patient Instructions (Signed)
Please come back for a follow-up appointment in 6 months, when you will probably be due for another ultrasound. We'll call [facility], to get blood test results.

## 2016-12-25 NOTE — Progress Notes (Signed)
Subjective:    Patient ID: Bonnie Alvarez, female    DOB: April 12, 1940, 76 y.o.   MRN: [id number]  HPI Pt returns for f/u of large goiter and hypothyroidism (both dx'ed 2003; son provides hx, due to pt's frail elderly status; she has been on prescribed thyroid hormone therapy since dx; she declines bx, so plan is to follow [place]).  Pt says she had blood drawn this am and yest am.  Our office calls pt's facility, but TSH was not done.  She denies neck swelling.   Past Medical History:  Diagnosis Date  . Anemia   . Chronic kidney disease    secondary hyperparathyroidism  . GERD (gastroesophageal reflux disease)   . Gout, unspecified   . Hyperlipidemia   . Hypertension   . Hypothyroid   . Monoclonal gammopathy   . Obesity   . Osteoarthritis   . Secondary hyperparathyroidism [facility])     Past Surgical History:  Procedure Laterality Date  . BACK SURGERY    . COLONOSCOPY W/ POLYPECTOMY    . ORIF ANKLE FRACTURE Right 11/14/2015   Procedure: OPEN REDUCTION INTERNAL FIXATION (ORIF) RIGHT ANKLE FRACTURE;  Surgeon: Renette Butters, MD;  Location: [place];  Service: Orthopedics;  Laterality: Right;    Social History   Social History  . Marital status: Widowed    Spouse name: N/A  . Number of children: N/A  . Years of education: N/A   Occupational History  . Not on file.   Social History Main Topics  . Smoking status: Former Smoker    Packs/day: 0.25    Years: 2.00  . Smokeless tobacco: Never Used  . Alcohol use No  . Drug use: No  . Sexual activity: No   Other Topics Concern  . Not on file   Social History Narrative  . No narrative on file    Current Outpatient Prescriptions on File Prior to Visit  Medication Sig Dispense Refill  . acetaminophen (TYLENOL) 325 MG tablet Take 650 mg by mouth every 4 (four) hours as needed.     Marland Kitchen acetaminophen (TYLENOL) 500 MG tablet Take 1 tablet (500 mg total) by mouth every 4 (four) hours as needed for mild pain or moderate pain. 30 tablet 0   . allopurinol (ZYLOPRIM) 100 MG tablet Take 100 mg by mouth 2 (two) times daily.     Marland Kitchen amLODipine (NORVASC) 5 MG tablet Take 5 mg by mouth every evening.     Marland Kitchen atorvastatin (LIPITOR) 20 MG tablet Take 20 mg by mouth every evening.     . bisacodyl (DULCOLAX) 10 MG suppository Place 10 mg rectally daily as needed for moderate constipation.    . Cholecalciferol 50000 units capsule Take 50,000 Units by mouth every 30 (thirty) days.    . cloNIDine (CATAPRES) 0.1 MG tablet Take 0.1 mg by mouth as needed. Give if SBP >180 and/or DBP >110 on ESA injection daily only.  If BP within parameters after 30 minutes, give ESA injection.  If BP not within parameters notify MD at Fax [phone]    . Darbepoetin Alfa (ARANESP) 200 MCG/0.4ML SOSY injection Inject 200 mcg into the skin every 28 (twenty-eight) days. Per [facility] last dose 10/17/15 (MD aware)    . ferrous sulfate 325 (65 FE) MG tablet Take 325 mg by mouth 2 (two) times daily.    . furosemide (LASIX) 80 MG tablet Take 80 mg by mouth daily.    . hydrALAZINE (APRESOLINE) 100 MG tablet Take 100 mg by  mouth 3 (three) times daily.     Marland Kitchen levothyroxine (SYNTHROID, LEVOTHROID) 75 MCG tablet Take 75 mcg by mouth daily before breakfast.    . lidocaine (LIDODERM) 5 % Place 1 patch onto the skin daily. Remove & Discard patch within 12 hours or as directed by MD    . loperamide (IMODIUM) 2 MG capsule Take 2 mg by mouth as needed for diarrhea or loose stools.    . metoprolol (LOPRESSOR) 50 MG tablet Take 50 mg by mouth 2 (two) times daily.     Marland Kitchen omeprazole (PRILOSEC) 20 MG capsule Take 20 mg by mouth daily.    . phenol (CHLORASEPTIC) 1.4 % LIQD Use as directed 1 spray in the mouth or throat as needed for throat irritation / pain.    . polyethylene glycol (MIRALAX / GLYCOLAX) packet Take 17 g by mouth 2 (two) times daily.     . sennosides-docusate sodium (SENOKOT-S) 8.6-50 MG tablet Take 2 tablets by mouth 2 (two) times daily.    . traMADol (ULTRAM) 50 MG tablet  Take 50 mg by mouth every 4 (four) hours as needed for moderate pain or severe pain.      No current facility-administered medications on file prior to visit.     Allergies  Allergen Reactions  . Hydrocodone Bitart (Antituss) [Hydrocodone] Other (See Comments)    UNSPECIFIED   . Lyrica [Pregabalin] Other (See Comments)    UNSPECIFIED   . Penicillins Other (See Comments)    UNSPECIFIED  Has patient had a PCN reaction causing immediate rash, facial/tongue/throat swelling, SOB or lightheadedness with hypotension: unknown Has patient had a PCN reaction causing severe rash involving mucus membranes or skin necrosis: unknown Has patient had a PCN reaction that required hospitalization: unknown Has patient had a PCN reaction occurring within the last 10 years: no If all of the above answers are "NO", then may proceed with Cephalosporin use.     Family History  Problem Relation Age of Onset  . Thyroid disease Neg Hx   . Breast cancer Neg Hx     BP (!) 170/80   Pulse 70   Wt 182 lb 3.2 oz (82.6 kg)   SpO2 96%   BMI 29.41 kg/m   Review of Systems Denies neck pain.     Objective:   Physical Exam VS: see vs page.  GEN: no distress.  In wheelchair.  NECK: thyroid is approx twice normal size.  I do not appreciate a nodule.       Assessment & Plan:  Hypothyroidism: due for recheck Goiter: she declines f/u US until next year.  HTN: pt should f/u this with PCP.   Please come back for a follow-up appointment in 6 months Check TSH today

## 2016-12-26 ENCOUNTER — Telehealth: Payer: Self-pay | Admitting: Endocrinology

## 2016-12-26 NOTE — Telephone Encounter (Signed)
Pt son returned call re: lab results. Please call.

## 2016-12-27 NOTE — Telephone Encounter (Signed)
Patient's son notified & fax sent facility [facility].

## 2017-02-27 IMAGING — CR DG ANKLE 2V *R*
3 series · 3 of 3 positions shown · non-contrast
Comparison: None.

CLINICAL DATA: Fall with twisting injury while walking to the
bathroom at her home. Deformity.

EXAM:
RIGHT ANKLE - 2 VIEW

[x ankle lat right]
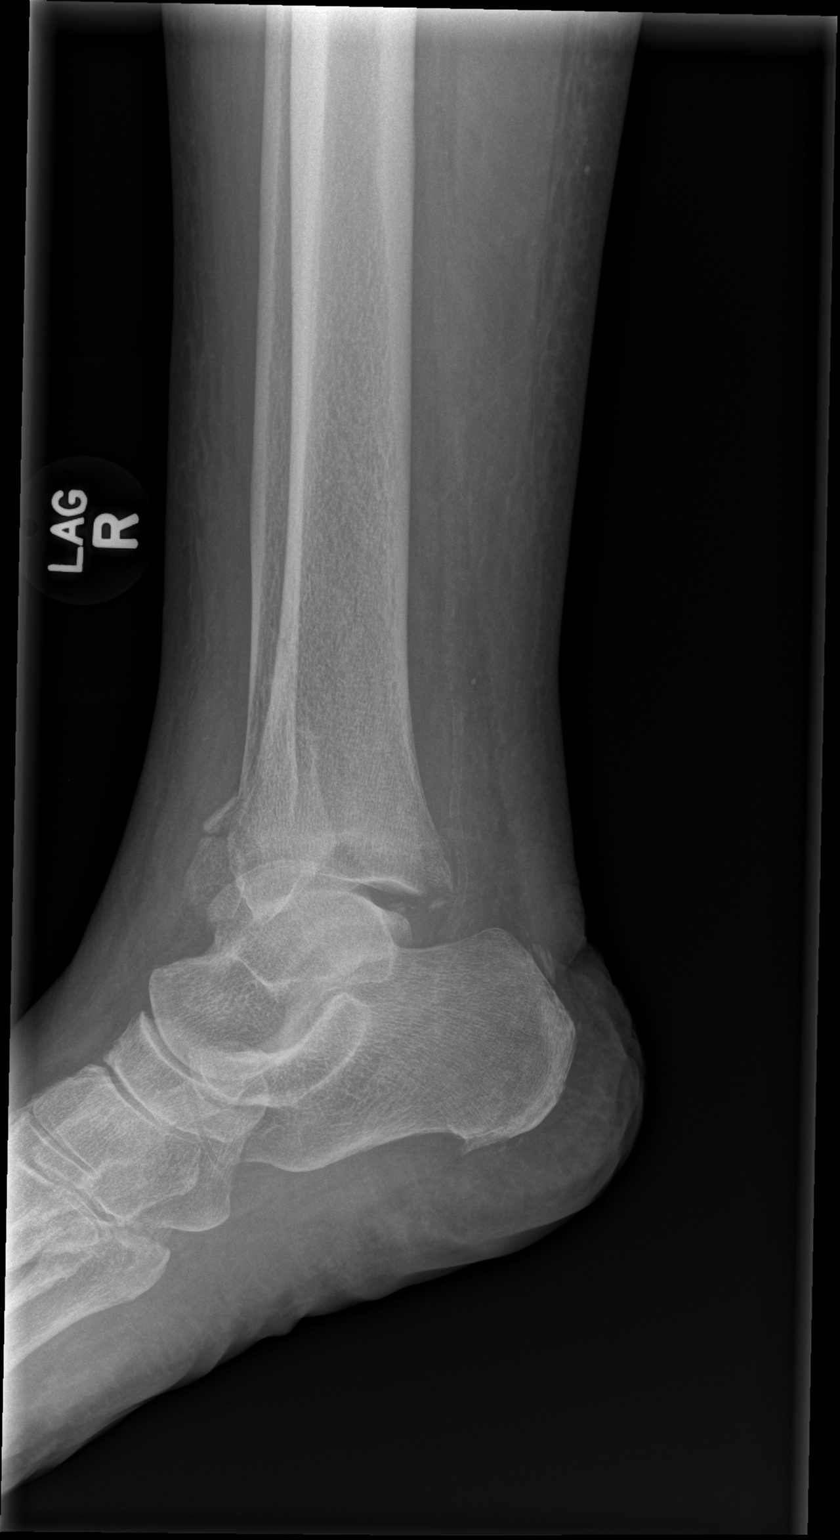

[x ankle ap right (1 of 2)]
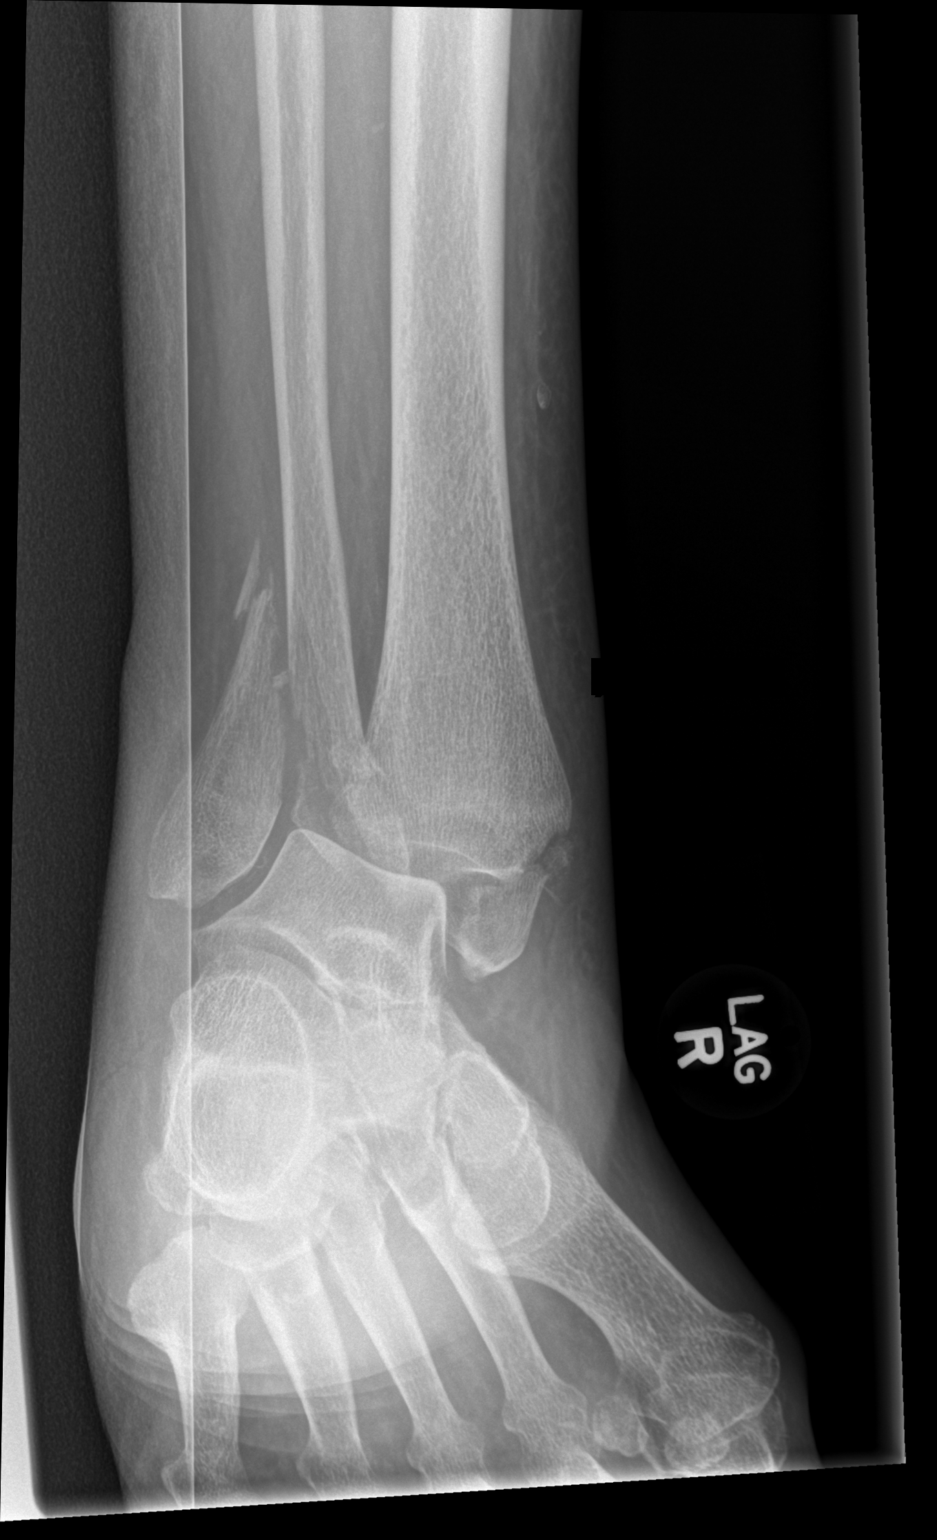

[x ankle ap right (2 of 2)]
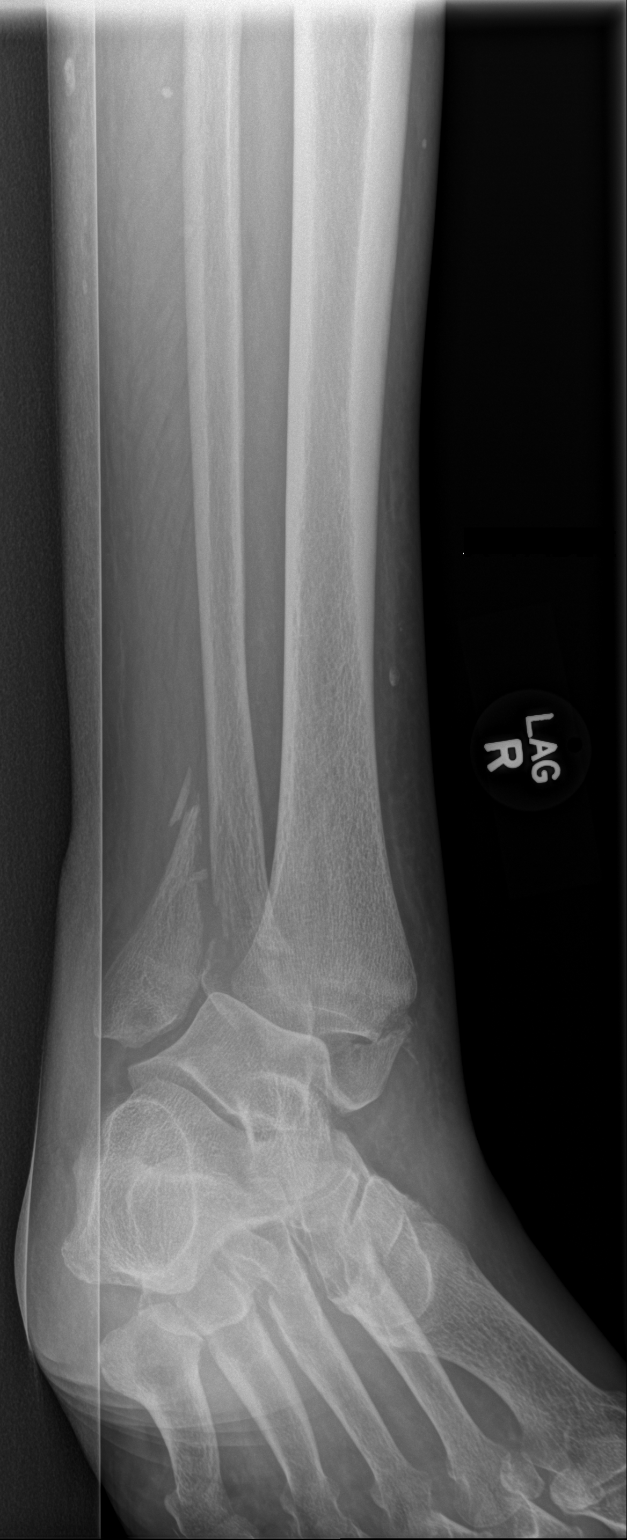

[3 of 3 positions shown; findings below may reference images not displayed]

FINDINGS: Displaced trimalleolar fracture. Distal fibular fracture just
proximal to the ankle mortise is comminuted, displaced and minimally
angulated. Medial malleolus fracture primarily transverse in
orientation, distal fracture fragment remains aligned with the
talus. There is lateral talar subluxation and apex medial
angulation. Posterior tibial tubercle fracture is comminuted. Soft
tissue edema at the fracture site.
IMPRESSION: Displaced trimalleolar fracture with lateral subluxation and
angulation of the talus with respect to the distal tibia.

## 2017-02-27 IMAGING — DX DG ANKLE PORT 2V*R*
2 series · 2 of 2 positions shown · non-contrast
Comparison: Pre reduction radiographs earlier this day.

CLINICAL DATA: Fracture, postreduction.

EXAM:
PORTABLE RIGHT ANKLE - 2 VIEW

[ankle ap]
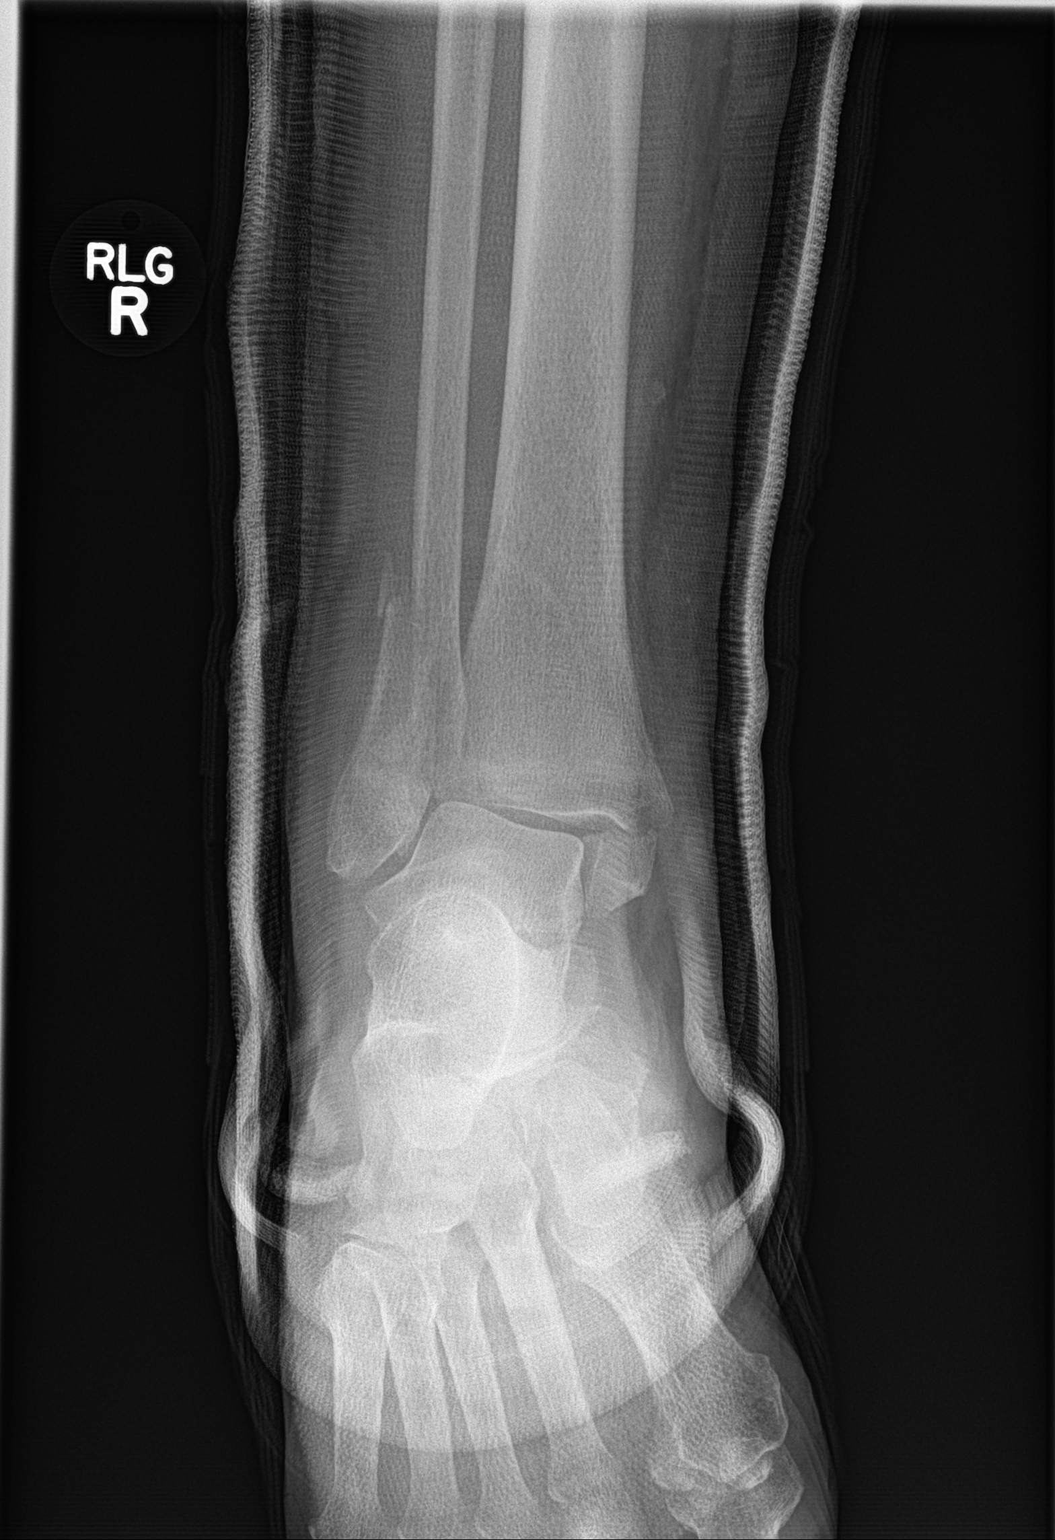

[ankle mortise]
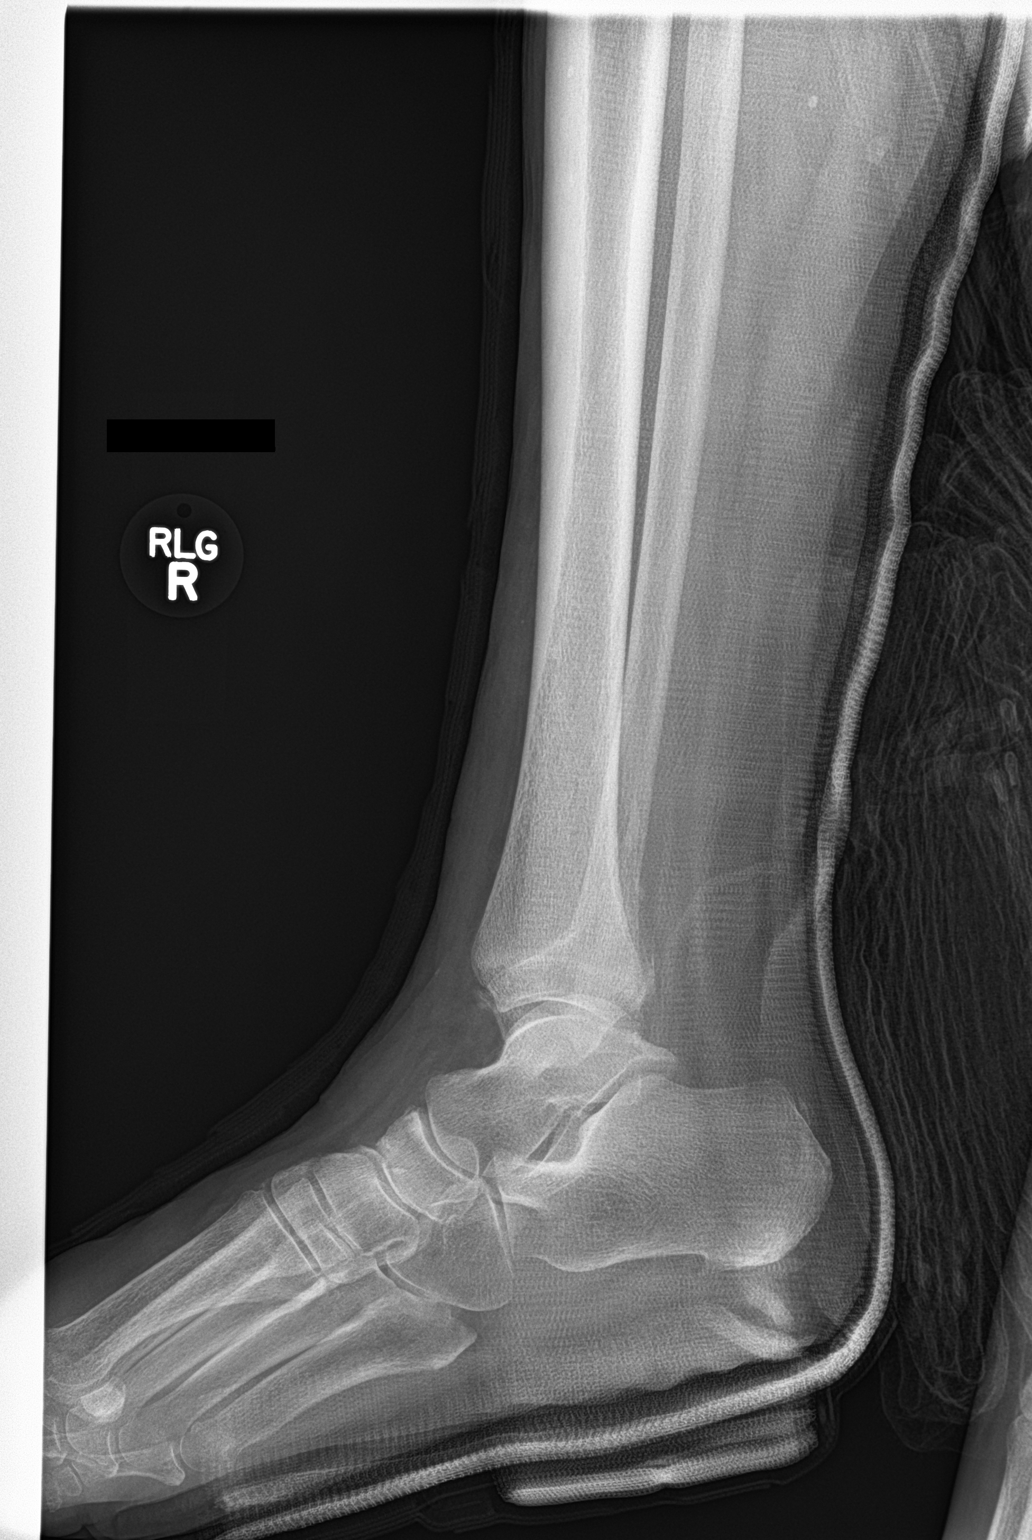

[2 of 2 positions shown; findings below may reference images not displayed]

FINDINGS: Improved alignment of the trimalleolar fracture postreduction. There
is minimal residual lateral talar subluxation and talar tilt.
Overlying cast material partially obscures osseous and soft tissue
fine detail.
IMPRESSION: Improved alignment of trimalleolar fracture postreduction with mild
residual talar subluxation and talar tilt.

## 2017-06-25 ENCOUNTER — Ambulatory Visit: Payer: Medicare Other | Admitting: Endocrinology

## 2017-07-22 ENCOUNTER — Emergency Department (HOSPITAL_COMMUNITY)
Admission: EM | Admit: 2017-07-22 | Discharge: 2017-07-22 | Disposition: A | Discharge status: Home / Self Care | Payer: Medicare Other | Attending: Emergency Medicine | Admitting: Emergency Medicine

## 2017-07-22 ENCOUNTER — Encounter (HOSPITAL_COMMUNITY): Payer: Self-pay | Admitting: Emergency Medicine

## 2017-07-22 DIAGNOSIS — Z87891 Personal history of nicotine dependence: Secondary | ICD-10-CM | POA: Diagnosis not present

## 2017-07-22 DIAGNOSIS — N189 Chronic kidney disease, unspecified: Secondary | ICD-10-CM | POA: Insufficient documentation

## 2017-07-22 DIAGNOSIS — I129 Hypertensive chronic kidney disease with stage 1 through stage 4 chronic kidney disease, or unspecified chronic kidney disease: Secondary | ICD-10-CM | POA: Insufficient documentation

## 2017-07-22 DIAGNOSIS — D649 Anemia, unspecified: Secondary | ICD-10-CM

## 2017-07-22 DIAGNOSIS — E039 Hypothyroidism, unspecified: Secondary | ICD-10-CM | POA: Insufficient documentation

## 2017-07-22 DIAGNOSIS — Z79899 Other long term (current) drug therapy: Secondary | ICD-10-CM | POA: Diagnosis not present

## 2017-07-22 DIAGNOSIS — Z130 Encounter for screening for diseases of the blood and blood-forming organs and certain disorders involving the immune mechanism: Secondary | ICD-10-CM | POA: Insufficient documentation

## 2017-07-22 LAB — CBC WITH DIFFERENTIAL/PLATELET
Basophils Absolute: 0 10*3/uL (ref 0.0–0.1)
Basophils Relative: 0 %
Eosinophils Absolute: 0.4 10*3/uL (ref 0.0–0.7)
Eosinophils Relative: 4 %
HEMATOCRIT: 24.6 % — AB (ref 36.0–46.0)
HEMOGLOBIN: 7.8 g/dL — AB (ref 12.0–15.0)
LYMPHS ABS: 2.8 10*3/uL (ref 0.7–4.0)
Lymphocytes Relative: 27 %
MCH: 30.1 pg (ref 26.0–34.0)
MCHC: 31.7 g/dL (ref 30.0–36.0)
MCV: 95 fL (ref 78.0–100.0)
MONOS PCT: 9 %
Monocytes Absolute: 0.9 10*3/uL (ref 0.1–1.0)
NEUTROS ABS: 6.4 10*3/uL (ref 1.7–7.7)
NEUTROS PCT: 60 %
Platelets: 232 10*3/uL (ref 150–400)
RBC: 2.59 MIL/uL — AB (ref 3.87–5.11)
RDW: 15.4 % (ref 11.5–15.5)
WBC: 10.5 10*3/uL (ref 4.0–10.5)

## 2017-07-22 LAB — COMPREHENSIVE METABOLIC PANEL
ALBUMIN: 3.4 g/dL — AB (ref 3.5–5.0)
ALK PHOS: 72 U/L (ref 38–126)
ALT: 9 U/L — AB (ref 14–54)
ANION GAP: 9 (ref 5–15)
AST: 15 U/L (ref 15–41)
BILIRUBIN TOTAL: 0.4 mg/dL (ref 0.3–1.2)
BUN: 63 mg/dL — AB (ref 6–20)
CALCIUM: 9.5 mg/dL (ref 8.9–10.3)
CO2: 17 mmol/L — ABNORMAL LOW (ref 22–32)
CREATININE: 5.03 mg/dL — AB (ref 0.44–1.00)
Chloride: 113 mmol/L — ABNORMAL HIGH (ref 101–111)
GFR calc Af Amer: 9 mL/min — ABNORMAL LOW (ref >60)
GFR calc non Af Amer: 7 mL/min — ABNORMAL LOW (ref >60)
GLUCOSE: 93 mg/dL (ref 65–99)
Potassium: 4.3 mmol/L (ref 3.5–5.1)
Sodium: 139 mmol/L (ref 135–145)
Total Protein: 7.3 g/dL (ref 6.5–8.1)

## 2017-07-22 NOTE — ED Triage Notes (Signed)
Pt at [facility] and being followed for CKD and anemia. Had blood work today that showed decrease in her HGB and HCT. Denies any n/v/d or bloody stools or any other bleeding.

## 2017-07-22 NOTE — ED Provider Notes (Signed)
[place] DEPT Provider Note   CSN: [id number] Arrival date & time: 07/22/17  1712     History   Chief Complaint Chief Complaint  Patient presents with  . abnormal labs    low HGB HCT    HPI Bonnie Alvarez is a 77 y.o. female.  HPI  77 year old female sent in  from her living facility due to a hemoglobin of 6.6.  This is obtained on lab work today.  On her paperwork, hemoglobin on 5/1 was 7.6.  The patient is asymptomatic and states she is not having any dizziness, lightheadedness, weakness, chest pain or shortness of breath.  She denies any type of bleeding including melena or bloody stools.  She has chronic kidney disease that is being managed by Dr. Joelyn Oms.  She states that she does not want to go on dialysis, even if it kills her.  She was sent in due to the low hemoglobin result.  Past Medical History:  Diagnosis Date  . Anemia   . Chronic kidney disease    secondary hyperparathyroidism  . GERD (gastroesophageal reflux disease)   . Gout, unspecified   . Hyperlipidemia   . Hypertension   . Hypothyroid   . Monoclonal gammopathy   . Obesity   . Osteoarthritis   . Secondary hyperparathyroidism [facility])     Patient Active Problem List   Diagnosis Date Noted  . Hypothyroidism 12/25/2016  . Essential hypertension 07/09/2016  . Chronic constipation 07/09/2016  . Multinodular goiter 05/29/2016  . Closed right ankle fracture 11/14/2015  . Anemia of chronic disease 10/12/2015    Past Surgical History:  Procedure Laterality Date  . BACK SURGERY    . COLONOSCOPY W/ POLYPECTOMY    . ORIF ANKLE FRACTURE Right 11/14/2015   Procedure: OPEN REDUCTION INTERNAL FIXATION (ORIF) RIGHT ANKLE FRACTURE;  Surgeon: Renette Butters, MD;  Location: [place];  Service: Orthopedics;  Laterality: Right;     OB History   None      Home Medications    Prior to Admission medications   Medication Sig Start Date End Date Taking? Authorizing Provider    acetaminophen (TYLENOL) 325 MG tablet Take 650 mg by mouth every 4 (four) hours as needed.     [provider]  acetaminophen (TYLENOL) 500 MG tablet Take 1 tablet (500 mg total) by mouth every 4 (four) hours as needed for mild pain or moderate pain. 04/27/16   Antonietta Breach, PA-C  allopurinol (ZYLOPRIM) 100 MG tablet Take 100 mg by mouth 2 (two) times daily.  08/26/12   [provider]  amLODipine (NORVASC) 5 MG tablet Take 5 mg by mouth every evening.     [provider]  atorvastatin (LIPITOR) 20 MG tablet Take 20 mg by mouth every evening.  08/26/12   [provider]  bisacodyl (DULCOLAX) 10 MG suppository Place 10 mg rectally daily as needed for moderate constipation.    [provider]  Cholecalciferol 50000 units capsule Take 50,000 Units by mouth every 30 (thirty) days.    [provider]  cloNIDine (CATAPRES) 0.1 MG tablet Take 0.1 mg by mouth as needed. Give if SBP >180 and/or DBP >110 on ESA injection daily only.  If BP within parameters after 30 minutes, give ESA injection.  If BP not within parameters notify MD at Fax [phone]    [provider]  Darbepoetin Alfa (ARANESP) 200 MCG/0.4ML SOSY injection Inject 200 mcg into the skin every 28 (twenty-eight) days. Per [facility] last  dose 10/17/15 (MD aware)    [provider]  ferrous sulfate 325 (65 FE) MG tablet Take 325 mg by mouth 2 (two) times daily.    [provider]  furosemide (LASIX) 80 MG tablet Take 80 mg by mouth daily.    [provider]  hydrALAZINE (APRESOLINE) 100 MG tablet Take 100 mg by mouth 3 (three) times daily.     [provider]  levothyroxine (SYNTHROID, LEVOTHROID) 75 MCG tablet Take 75 mcg by mouth daily before breakfast.    [provider]  lidocaine (LIDODERM) 5 % Place 1 patch onto the skin daily. Remove & Discard patch within 12 hours or as directed by MD    [provider]  loperamide (IMODIUM)  2 MG capsule Take 2 mg by mouth as needed for diarrhea or loose stools.    [provider]  metoprolol (LOPRESSOR) 50 MG tablet Take 50 mg by mouth 2 (two) times daily.     [provider]  omeprazole (PRILOSEC) 20 MG capsule Take 20 mg by mouth daily.    [provider]  phenol (CHLORASEPTIC) 1.4 % LIQD Use as directed 1 spray in the mouth or throat as needed for throat irritation / pain.    [provider]  polyethylene glycol (MIRALAX / GLYCOLAX) packet Take 17 g by mouth 2 (two) times daily.     [provider]  sennosides-docusate sodium (SENOKOT-S) 8.6-50 MG tablet Take 2 tablets by mouth 2 (two) times daily.    [provider]  traMADol (ULTRAM) 50 MG tablet Take 50 mg by mouth every 4 (four) hours as needed for moderate pain or severe pain.     [provider]    Family History Family History  Problem Relation Age of Onset  . Thyroid disease Neg Hx   . Breast cancer Neg Hx     Social History Social History   Tobacco Use  . Smoking status: Former Smoker    Packs/day: 0.25    Years: 2.00    Pack years: 0.50  . Smokeless tobacco: Never Used  Substance Use Topics  . Alcohol use: No  . Drug use: No     Allergies   Hydrocodone bitart (antituss) [hydrocodone]; Lyrica [pregabalin]; and Penicillins   Review of Systems Review of Systems  Constitutional: Negative for fatigue.  Respiratory: Negative for shortness of breath.   Cardiovascular: Negative for chest pain.  Gastrointestinal: Negative for blood in stool and vomiting.  Neurological: Negative for dizziness, weakness and light-headedness.  All other systems reviewed and are negative.    Physical Exam Updated Vital Signs BP (!) 147/65 (BP Location: Left Arm)   Pulse 65   Temp 98.3 F (36.8 C) (Oral)   Resp 19   SpO2 100%   Physical Exam  Constitutional: She is oriented to person, place, and time. She appears well-developed and well-nourished. No  distress.  HENT:  Head: Normocephalic and atraumatic.  Right Ear: External ear normal.  Left Ear: External ear normal.  Nose: Nose normal.  Eyes: Right eye exhibits no discharge. Left eye exhibits no discharge.  Cardiovascular: Normal rate, regular rhythm and normal heart sounds.  Pulmonary/Chest: Effort normal and breath sounds normal.  Abdominal: She exhibits no distension.  Neurological: She is alert and oriented to person, place, and time.  Skin: Skin is warm and dry. She is not diaphoretic.  Nursing note and vitals reviewed.    ED Treatments / Results  Labs (all labs ordered are listed, but  only abnormal results are displayed) Labs Reviewed  CBC WITH DIFFERENTIAL/PLATELET - Abnormal; Notable for the following components:      Result Value   RBC 2.59 (*)    Hemoglobin 7.8 (*)    HCT 24.6 (*)    All other components within normal limits  COMPREHENSIVE METABOLIC PANEL - Abnormal; Notable for the following components:   Chloride 113 (*)    CO2 17 (*)    BUN 63 (*)    Creatinine, Ser 5.03 (*)    Albumin 3.4 (*)    ALT 9 (*)    GFR calc non Af Amer 7 (*)    GFR calc Af Amer 9 (*)    All other components within normal limits    EKG None  Radiology No results found.  Procedures Procedures (including critical care time)  Medications Ordered in ED Medications - No data to display   Initial Impression / Assessment and Plan / ED Course  I have reviewed the triage vital signs and the nursing notes.  Pertinent labs & imaging results that were available during my care of the patient were reviewed by me and considered in my medical decision making (see chart for details).     Patient's hemoglobin on our lab work is 7.8.  This is consistent with her lab work from last week.  Given no signs or symptoms of acute bleeding with otherwise unremarkable vital signs, I do not think further emergent work-up is needed.  This is likely due to her chronic kidney disease which is  mildly worse.  She will need follow-up with her kidney specialist.  Otherwise, she appears stable for discharge home and we discussed return precautions and signs and symptoms of anemia.  Final Clinical Impressions(s) / ED Diagnoses   Final diagnoses:  Anemia, unspecified type    ED Discharge Orders    None       Sherwood Gambler, MD 07/22/17 2000

## 2017-07-22 NOTE — Discharge Instructions (Addendum)
Your hemoglobin today is 7.8.  You need to follow-up with your kidney specialist for further outpatient work-up and treatment of low hemoglobin.  If you develop any source of bleeding such as bloody stools or black stools, or you start feeling dizzy, weak, or have chest pain or shortness of breath, then return to the ER or see your doctor.

## 2018-07-20 ENCOUNTER — Encounter (HOSPITAL_COMMUNITY): Payer: Self-pay

## 2018-07-20 ENCOUNTER — Emergency Department (HOSPITAL_COMMUNITY)
Admission: EM | Admit: 2018-07-20 | Discharge: 2018-07-21 | Disposition: A | Discharge status: Skilled Nursing Facility | Payer: Medicare Other | Attending: Emergency Medicine | Admitting: Emergency Medicine

## 2018-07-20 ENCOUNTER — Other Ambulatory Visit: Payer: Self-pay

## 2018-07-20 DIAGNOSIS — Z87891 Personal history of nicotine dependence: Secondary | ICD-10-CM | POA: Insufficient documentation

## 2018-07-20 DIAGNOSIS — E039 Hypothyroidism, unspecified: Secondary | ICD-10-CM | POA: Diagnosis not present

## 2018-07-20 DIAGNOSIS — D649 Anemia, unspecified: Secondary | ICD-10-CM | POA: Insufficient documentation

## 2018-07-20 DIAGNOSIS — N189 Chronic kidney disease, unspecified: Secondary | ICD-10-CM | POA: Diagnosis not present

## 2018-07-20 DIAGNOSIS — I129 Hypertensive chronic kidney disease with stage 1 through stage 4 chronic kidney disease, or unspecified chronic kidney disease: Secondary | ICD-10-CM | POA: Insufficient documentation

## 2018-07-20 LAB — CBC WITH DIFFERENTIAL/PLATELET
Abs Immature Granulocytes: 0.07 10*3/uL (ref 0.00–0.07)
Basophils Absolute: 0 10*3/uL (ref 0.0–0.1)
Basophils Relative: 0 %
Eosinophils Absolute: 0.4 10*3/uL (ref 0.0–0.5)
Eosinophils Relative: 4 %
HCT: 23.6 % — ABNORMAL LOW (ref 36.0–46.0)
Hemoglobin: 7.3 g/dL — ABNORMAL LOW (ref 12.0–15.0)
Immature Granulocytes: 1 %
Lymphocytes Relative: 32 %
Lymphs Abs: 2.9 10*3/uL (ref 0.7–4.0)
MCH: 30.4 pg (ref 26.0–34.0)
MCHC: 30.9 g/dL (ref 30.0–36.0)
MCV: 98.3 fL (ref 80.0–100.0)
Monocytes Absolute: 0.8 10*3/uL (ref 0.1–1.0)
Monocytes Relative: 9 %
Neutro Abs: 5 10*3/uL (ref 1.7–7.7)
Neutrophils Relative %: 54 %
Platelets: 250 10*3/uL (ref 150–400)
RBC: 2.4 MIL/uL — ABNORMAL LOW (ref 3.87–5.11)
RDW: 16 % — ABNORMAL HIGH (ref 11.5–15.5)
WBC: 9.2 10*3/uL (ref 4.0–10.5)
nRBC: 0.2 % (ref 0.0–0.2)

## 2018-07-20 LAB — BASIC METABOLIC PANEL
Anion gap: 8 (ref 5–15)
BUN: 64 mg/dL — ABNORMAL HIGH (ref 8–23)
CO2: 21 mmol/L — ABNORMAL LOW (ref 22–32)
Calcium: 8.5 mg/dL — ABNORMAL LOW (ref 8.9–10.3)
Chloride: 106 mmol/L (ref 98–111)
Creatinine, Ser: 5.45 mg/dL — ABNORMAL HIGH (ref 0.44–1.00)
GFR calc Af Amer: 8 mL/min — ABNORMAL LOW (ref >60)
GFR calc non Af Amer: 7 mL/min — ABNORMAL LOW (ref >60)
Glucose, Bld: 123 mg/dL — ABNORMAL HIGH (ref 70–99)
Potassium: 3.7 mmol/L (ref 3.5–5.1)
Sodium: 135 mmol/L (ref 135–145)

## 2018-07-20 LAB — ABO/RH: ABO/RH(D): O POS

## 2018-07-20 LAB — PREPARE RBC (CROSSMATCH)

## 2018-07-20 MED ORDER — SODIUM CHLORIDE 0.9 % IV SOLN
10.0000 mL/h | Freq: Once | INTRAVENOUS | Status: AC
  Administered 2018-07-20: 10 mL/h via INTRAVENOUS

## 2018-07-20 NOTE — ED Notes (Signed)
Bed: ET48 Expected date:  Expected time:  Means of arrival:  Comments: EMS 78 yo low hgb

## 2018-07-20 NOTE — ED Provider Notes (Signed)
Emergency Department Provider Note   I have reviewed the triage vital signs and the nursing notes.   HISTORY  Chief Complaint Anemia   HPI Bonnie Alvarez is a 78 y.o. female with PMH of GERD, HLD, HTN, and anemia of chronic disease presents to the emergency department with low hemoglobin levels noticed by her physician at [facility] rehabilitation.  Patient undergoes regular monitoring of her hemoglobin with history of anemia of chronic disease.  She has required blood transfusion once in the past.  She denies any significant weakness, shortness of breath, lightheadedness.  She has not experienced any blood or black bowel movements.  She has no history of GI bleeding.  She is not anticoagulated.  She denies any fevers or shaking chills.  No chest pain.   Past Medical History:  Diagnosis Date  . Anemia   . Chronic kidney disease    secondary hyperparathyroidism  . GERD (gastroesophageal reflux disease)   . Gout, unspecified   . Hyperlipidemia   . Hypertension   . Hypothyroid   . Monoclonal gammopathy   . Obesity   . Osteoarthritis   . Secondary hyperparathyroidism [facility])     Patient Active Problem List   Diagnosis Date Noted  . Hypothyroidism 12/25/2016  . Essential hypertension 07/09/2016  . Chronic constipation 07/09/2016  . Multinodular goiter 05/29/2016  . Closed right ankle fracture 11/14/2015  . Anemia of chronic disease 10/12/2015    Past Surgical History:  Procedure Laterality Date  . BACK SURGERY    . COLONOSCOPY W/ POLYPECTOMY    . ORIF ANKLE FRACTURE Right 11/14/2015   Procedure: OPEN REDUCTION INTERNAL FIXATION (ORIF) RIGHT ANKLE FRACTURE;  Surgeon: Renette Butters, MD;  Location: [place];  Service: Orthopedics;  Laterality: Right;    Allergies Hydrocodone bitart (antituss) [hydrocodone]; Lyrica [pregabalin]; and Penicillins  Family History  Problem Relation Age of Onset  . Thyroid disease Neg Hx   . Breast cancer Neg Hx     Social History Social  History   Tobacco Use  . Smoking status: Former Smoker    Packs/day: 0.25    Years: 2.00    Pack years: 0.50  . Smokeless tobacco: Never Used  Substance Use Topics  . Alcohol use: No  . Drug use: No    Review of Systems  Constitutional: No fever/chills. Positive anemia.  Eyes: No visual changes. ENT: No sore throat. Cardiovascular: Denies chest pain. Respiratory: Denies shortness of breath. Gastrointestinal: No abdominal pain.  No nausea, no vomiting.  No diarrhea.  No constipation. Genitourinary: Negative for dysuria. Musculoskeletal: Negative for back pain. Skin: Negative for rash. Neurological: Negative for headaches, focal weakness or numbness.  10-point ROS otherwise negative.  ____________________________________________   PHYSICAL EXAM:  VITAL SIGNS: ED Triage Vitals  Enc Vitals Group     BP 07/20/18 1641 (!) 153/72     Pulse Rate 07/20/18 1641 68     Resp 07/20/18 1641 18     Temp 07/20/18 1641 98.1 F (36.7 C)     Temp Source 07/20/18 1641 Oral     SpO2 07/20/18 1641 99 %     Pain Score 07/20/18 1640 0   Constitutional: Alert and oriented. Well appearing and in no acute distress. Eyes: Conjunctivae are normal. Head: Atraumatic. Nose: No congestion/rhinnorhea. Mouth/Throat: Mucous membranes are moist.  Neck: No stridor.  Cardiovascular: Normal rate, regular rhythm. Good peripheral circulation. Grossly normal heart sounds.   Respiratory: Normal respiratory effort.  No retractions. Lungs CTAB. Gastrointestinal: Soft and nontender. No  distention.  Musculoskeletal: No lower extremity tenderness nor edema. No gross deformities of extremities. Neurologic:  Normal speech and language. No gross focal neurologic deficits are appreciated.  Skin:  Skin is warm, dry and intact. No rash noted.  ____________________________________________   LABS (all labs ordered are listed, but only abnormal results are displayed)  Labs Reviewed  BASIC METABOLIC PANEL -  Abnormal; Notable for the following components:      Result Value   CO2 21 (*)    Glucose, Bld 123 (*)    BUN 64 (*)    Creatinine, Ser 5.45 (*)    Calcium 8.5 (*)    GFR calc non Af Amer 7 (*)    GFR calc Af Amer 8 (*)    All other components within normal limits  CBC WITH DIFFERENTIAL/PLATELET - Abnormal; Notable for the following components:   RBC 2.40 (*)    Hemoglobin 7.3 (*)    HCT 23.6 (*)    RDW 16.0 (*)    All other components within normal limits  TYPE AND SCREEN  PREPARE RBC (CROSSMATCH)  ABO/RH   ____________________________________________   PROCEDURES  Procedure(s) performed:   Procedures  CRITICAL CARE Performed by: Margette Fast Total critical care time: 35 minutes Critical care time was exclusive of separately billable procedures and treating other patients. Critical care was necessary to treat or prevent imminent or life-threatening deterioration. Critical care was time spent personally by me on the following activities: development of treatment plan with patient and/or surrogate as well as nursing, discussions with consultants, evaluation of patient's response to treatment, examination of patient, obtaining history from patient or surrogate, ordering and performing treatments and interventions, ordering and review of laboratory studies, ordering and review of radiographic studies, pulse oximetry and re-evaluation of patient's condition.  Nanda Quinton, MD Emergency Medicine  ____________________________________________   INITIAL IMPRESSION / ASSESSMENT AND PLAN / ED COURSE  Pertinent labs & imaging results that were available during my care of the patient were reviewed by me and considered in my medical decision making (see chart for details).    Patient presents to the emergency department with low hemoglobin on outpatient labs.  Plan to repeat here.  Patient with history of chronic kidney disease likely the cause of her anemia.  She was not having  significant symptoms at this time.  Hemoglobin in the range of 6.0 according to outpatient paperwork at bedside.  This is accurate, which was suspected is, plan to transfuse and likely discharge from the emergency department to avoid admit in the setting of COVID-19.   Hb 7.3 here. Labs reviewed. Will transfuse and discharge from the ED. Discussed with patient and son by phone. Consent for transfusion obtained.   Care transferred to Dr. Dina Rich pending completion of PRBC transfusion.  ____________________________________________  FINAL CLINICAL IMPRESSION(S) / ED DIAGNOSES  Final diagnoses:  Symptomatic anemia     MEDICATIONS GIVEN DURING THIS VISIT:  Medications  0.9 %  sodium chloride infusion (0 mL/hr Intravenous Stopped 07/21/18 0056)     Note:  This document was prepared using Dragon voice recognition software and may include unintentional dictation errors.  Nanda Quinton, MD Emergency Medicine    Chuck Caban, [facility], MD 07/22/18 [phone]

## 2018-07-20 NOTE — ED Triage Notes (Signed)
EMS reports coming from [place], labs ran @ facilty 07/20/18 Low HGB 6.3 Hemat 20.0. Lab results at bedside. Pt has Hx of anemia.  BP 128/70 HR 70 RR 18 Sp02 98 RA

## 2018-07-20 NOTE — ED Notes (Addendum)
Bonnie Sprang, NP called regarding the pt's treatment. I updated her on Hgb 6.4 and 2 unit blood transfusion.

## 2018-07-20 NOTE — ED Notes (Addendum)
Pt gave me her phone and requested I discuss her medical care with her son Dola Factor [phone]. Spoke with Jeneen Rinks and gave him an update on pt's care and blood administration.

## 2018-07-21 LAB — TYPE AND SCREEN
ABO/RH(D): O POS
Antibody Screen: NEGATIVE
Unit division: 0

## 2018-07-21 LAB — BPAM RBC
Blood Product Expiration Date: 202005212359
ISSUE DATE / TIME: 202005042120
Unit Type and Rh: 5100

## 2018-07-21 NOTE — ED Notes (Signed)
Spoke with [facility], they were told patient is rooming with a Covid19 positive patient. Staff was unaware of this until now.

## 2018-07-21 NOTE — ED Provider Notes (Signed)
Patient signed out pending blood transfusions.  On recheck, she is overall nontoxic-appearing and her vital signs are reassuring.  She received 1 unit of blood.  Originally 2 units were ordered; however, given blood shortages and original hemoglobin of 7.3, blood bank did not want to release a second unit unless recheck less than 7.  She is currently asymptomatic.  Feel it is reasonable for her to be discharged with follow-up hemoglobin in 1 week.  Patient stated understanding and is in agreement with plan.  After history, exam, and medical workup I feel the patient has been appropriately medically screened and is safe for discharge home. Pertinent diagnoses were discussed with the patient. Patient was given return precautions.    Merryl Hacker, MD 07/21/18 [phone]

## 2018-07-21 NOTE — ED Notes (Signed)
Went to lab for 2nd unit of blood. Lab said, due to blood shortage caused by COVID19, patients will not be given blood unless Hgb below 7. Provider made aware.

## 2018-07-21 NOTE — ED Notes (Signed)
Called [facility] 

## 2018-09-23 ENCOUNTER — Encounter (HOSPITAL_COMMUNITY): Payer: Self-pay | Admitting: Emergency Medicine

## 2018-09-23 ENCOUNTER — Emergency Department (HOSPITAL_COMMUNITY)
Admission: EM | Admit: 2018-09-23 | Discharge: 2018-09-23 | Disposition: A | Discharge status: Home / Self Care | Payer: Medicare Other | Attending: Emergency Medicine | Admitting: Emergency Medicine

## 2018-09-23 DIAGNOSIS — D649 Anemia, unspecified: Secondary | ICD-10-CM | POA: Diagnosis not present

## 2018-09-23 DIAGNOSIS — N189 Chronic kidney disease, unspecified: Secondary | ICD-10-CM | POA: Insufficient documentation

## 2018-09-23 DIAGNOSIS — D631 Anemia in chronic kidney disease: Secondary | ICD-10-CM | POA: Insufficient documentation

## 2018-09-23 DIAGNOSIS — Z79899 Other long term (current) drug therapy: Secondary | ICD-10-CM | POA: Insufficient documentation

## 2018-09-23 DIAGNOSIS — E039 Hypothyroidism, unspecified: Secondary | ICD-10-CM | POA: Insufficient documentation

## 2018-09-23 DIAGNOSIS — Z87891 Personal history of nicotine dependence: Secondary | ICD-10-CM | POA: Insufficient documentation

## 2018-09-23 DIAGNOSIS — I129 Hypertensive chronic kidney disease with stage 1 through stage 4 chronic kidney disease, or unspecified chronic kidney disease: Secondary | ICD-10-CM | POA: Diagnosis not present

## 2018-09-23 DIAGNOSIS — R718 Other abnormality of red blood cells: Secondary | ICD-10-CM | POA: Diagnosis present

## 2018-09-23 DIAGNOSIS — D638 Anemia in other chronic diseases classified elsewhere: Secondary | ICD-10-CM

## 2018-09-23 LAB — CBC WITH DIFFERENTIAL/PLATELET
Abs Immature Granulocytes: 0.08 10*3/uL — ABNORMAL HIGH (ref 0.00–0.07)
Basophils Absolute: 0 10*3/uL (ref 0.0–0.1)
Basophils Relative: 0 %
Eosinophils Absolute: 0.4 10*3/uL (ref 0.0–0.5)
Eosinophils Relative: 4 %
HCT: 25.4 % — ABNORMAL LOW (ref 36.0–46.0)
Hemoglobin: 7.7 g/dL — ABNORMAL LOW (ref 12.0–15.0)
Immature Granulocytes: 1 %
Lymphocytes Relative: 34 %
Lymphs Abs: 3.4 10*3/uL (ref 0.7–4.0)
MCH: 30.6 pg (ref 26.0–34.0)
MCHC: 30.3 g/dL (ref 30.0–36.0)
MCV: 100.8 fL — ABNORMAL HIGH (ref 80.0–100.0)
Monocytes Absolute: 0.7 10*3/uL (ref 0.1–1.0)
Monocytes Relative: 7 %
Neutro Abs: 5.6 10*3/uL (ref 1.7–7.7)
Neutrophils Relative %: 54 %
Platelets: 275 10*3/uL (ref 150–400)
RBC: 2.52 MIL/uL — ABNORMAL LOW (ref 3.87–5.11)
RDW: 15.4 % (ref 11.5–15.5)
WBC: 10.2 10*3/uL (ref 4.0–10.5)
nRBC: 0 % (ref 0.0–0.2)

## 2018-09-23 LAB — BASIC METABOLIC PANEL
Anion gap: 12 (ref 5–15)
BUN: 65 mg/dL — ABNORMAL HIGH (ref 8–23)
CO2: 17 mmol/L — ABNORMAL LOW (ref 22–32)
Calcium: 8.9 mg/dL (ref 8.9–10.3)
Chloride: 108 mmol/L (ref 98–111)
Creatinine, Ser: 6.26 mg/dL — ABNORMAL HIGH (ref 0.44–1.00)
GFR calc Af Amer: 7 mL/min — ABNORMAL LOW (ref >60)
GFR calc non Af Amer: 6 mL/min — ABNORMAL LOW (ref >60)
Glucose, Bld: 106 mg/dL — ABNORMAL HIGH (ref 70–99)
Potassium: 4.7 mmol/L (ref 3.5–5.1)
Sodium: 137 mmol/L (ref 135–145)

## 2018-09-23 LAB — TYPE AND SCREEN
ABO/RH(D): O POS
Antibody Screen: NEGATIVE

## 2018-09-23 LAB — ABO/RH: ABO/RH(D): O POS

## 2018-09-23 NOTE — Discharge Instructions (Addendum)
Your hemoglobin here is 7.7.  Your vital signs have been reassuring and we do not feel you need a blood transfusion today.  I have recommended that you follow-up with Dr. Alen Blew, who you saw previously for anemia.  Turn to ER if any severe shortness of breath, chest pain, lightheadedness, or passing out.

## 2018-09-23 NOTE — ED Provider Notes (Signed)
[facility] EMERGENCY DEPARTMENT Provider Note   CSN: [id number] Arrival date & time: 09/23/18  5956     History   Chief Complaint No chief complaint on file.   HPI Bonnie Alvarez is a 78 y.o. female.     78yo F w/ PMH including CKD, anemia of chronic disease, HTN, HLD, hypothyroidism, gout who p/w abnormal lab.  She has history of chronic anemia and has lab work drawn routinely at her nursing facility.  She recently had labs drawn and was sent to the ED today due to low hemoglobin.  Patient states that she has not noted any GI bleeding and has been in her usual state of health, may have mild shortness of breath with exertion but no severe symptoms, no cough/cold symptoms.  She is on iron daily.  The history is provided by the patient.    Past Medical History:  Diagnosis Date  . Anemia   . Chronic kidney disease    secondary hyperparathyroidism  . GERD (gastroesophageal reflux disease)   . Gout, unspecified   . Hyperlipidemia   . Hypertension   . Hypothyroid   . Monoclonal gammopathy   . Obesity   . Osteoarthritis   . Secondary hyperparathyroidism [facility])     Patient Active Problem List   Diagnosis Date Noted  . Hypothyroidism 12/25/2016  . Essential hypertension 07/09/2016  . Chronic constipation 07/09/2016  . Multinodular goiter 05/29/2016  . Closed right ankle fracture 11/14/2015  . Anemia of chronic disease 10/12/2015    Past Surgical History:  Procedure Laterality Date  . BACK SURGERY    . COLONOSCOPY W/ POLYPECTOMY    . ORIF ANKLE FRACTURE Right 11/14/2015   Procedure: OPEN REDUCTION INTERNAL FIXATION (ORIF) RIGHT ANKLE FRACTURE;  Surgeon: Renette Butters, MD;  Location: [place];  Service: Orthopedics;  Laterality: Right;     OB History   No obstetric history on file.      Home Medications    Prior to Admission medications   Medication Sig Start Date End Date Taking? Authorizing Provider  acetaminophen (TYLENOL) 500 MG tablet  Take 1 tablet (500 mg total) by mouth every 4 (four) hours as needed for mild pain or moderate pain. 04/27/16  Yes Antonietta Breach, PA-C  allopurinol (ZYLOPRIM) 100 MG tablet Take 100 mg by mouth 2 (two) times daily.  08/26/12  Yes [provider]  amLODipine (NORVASC) 5 MG tablet Take 5 mg by mouth every evening.    Yes [provider]  atorvastatin (LIPITOR) 20 MG tablet Take 20 mg by mouth every evening.  08/26/12  Yes [provider]  bisacodyl (DULCOLAX) 10 MG suppository Place 10 mg rectally as needed for moderate constipation.   Yes [provider]  calcitRIOL (ROCALTROL) 0.25 MCG capsule Take 0.25 mcg by mouth daily.   Yes [provider]  calcium carbonate (TUMS - DOSED IN MG ELEMENTAL CALCIUM) 500 MG chewable tablet Chew 2 tablets by mouth every 4 (four) hours as needed for indigestion or heartburn.   Yes [provider]  Cholecalciferol (VITAMIN D3) 1.25 MG (50000 UT) CAPS Take 1 capsule by mouth every 30 (thirty) days.   Yes [provider]  cinacalcet (SENSIPAR) 30 MG tablet Take 30 mg by mouth daily.   Yes [provider]  cloNIDine (CATAPRES) 0.1 MG tablet Take 0.1 mg by mouth daily.    Yes [provider]  epoetin alfa-epbx (RETACRIT) 38756 UNIT/ML injection Inject 10,000 Units into the skin every 14 (fourteen)  days.   Yes [provider]  ferrous sulfate 325 (65 FE) MG tablet Take 325 mg by mouth 2 (two) times daily with a meal.   Yes [provider]  furosemide (LASIX) 80 MG tablet Take 80 mg by mouth daily.   Yes [provider]  hydrALAZINE (APRESOLINE) 100 MG tablet Take 100 mg by mouth 3 (three) times daily.    Yes [provider]  ipratropium-albuterol (DUONEB) 0.5-2.5 (3) MG/3ML SOLN Take 3 mLs by nebulization every 4 (four) hours as needed (shortness or breath).   Yes [provider]  levothyroxine (SYNTHROID, LEVOTHROID) 75 MCG tablet Take 75 mcg by mouth  daily before breakfast.   Yes [provider]  Magnesium 200 MG TABS Take 2 tablets by mouth daily.   Yes [provider]  metoprolol (LOPRESSOR) 50 MG tablet Take 50 mg by mouth 2 (two) times daily.    Yes [provider]  mirtazapine (REMERON) 7.5 MG tablet Take 7.5 mg by mouth at bedtime.   Yes [provider]  omeprazole (PRILOSEC) 20 MG capsule Take 20 mg by mouth daily.   Yes [provider]  ondansetron (ZOFRAN) 4 MG tablet Take 4 mg by mouth every 4 (four) hours as needed for nausea or vomiting.   Yes [provider]  polyethylene glycol (MIRALAX / GLYCOLAX) packet Take 17 g by mouth 2 (two) times daily.    Yes [provider]  sennosides-docusate sodium (SENOKOT-S) 8.6-50 MG tablet Take 2 tablets by mouth 2 (two) times daily.   Yes [provider]  traMADol (ULTRAM) 50 MG tablet Take 50 mg by mouth every 4 (four) hours as needed for moderate pain or severe pain.    Yes [provider]  trolamine salicylate (ASPERCREME) 10 % cream Apply 1 application topically 3 (three) times daily as needed for muscle pain.   Yes [provider]  vitamin C (ASCORBIC ACID) 500 MG tablet Take 500 mg by mouth daily.   Yes [provider]  Darbepoetin Alfa (ARANESP) 200 MCG/0.4ML SOSY injection Inject 200 mcg into the skin every 14 (fourteen) days.     [provider]    Family History Family History  Problem Relation Age of Onset  . Thyroid disease Neg Hx   . Breast cancer Neg Hx     Social History Social History   Tobacco Use  . Smoking status: Former Smoker    Packs/day: 0.25    Years: 2.00    Pack years: 0.50  . Smokeless tobacco: Never Used  Substance Use Topics  . Alcohol use: No  . Drug use: No     Allergies   Hydrocodone bitart (antituss) [hydrocodone], Lyrica [pregabalin], and Penicillins   Review of Systems Review of Systems All other systems reviewed and are negative  except that which was mentioned in HPI   Physical Exam Updated Vital Signs BP (!) 177/80   Pulse 79   Temp 98.4 F (36.9 C) (Oral)   Resp (!) 26   SpO2 100%   Physical Exam Vitals signs and nursing note reviewed.  Constitutional:      General: She is not in acute distress.    Appearance: She is well-developed.  HENT:     Head: Normocephalic and atraumatic.  Eyes:     Conjunctiva/sclera: Conjunctivae normal.  Neck:     Musculoskeletal: Neck supple.  Cardiovascular:     Rate and Rhythm: Normal rate and regular rhythm.     Heart sounds: Murmur  present.  Pulmonary:     Effort: Pulmonary effort is normal.     Breath sounds: Normal breath sounds.  Abdominal:     General: Bowel sounds are normal. There is no distension.     Palpations: Abdomen is soft.     Tenderness: There is no abdominal tenderness.  Musculoskeletal:     Right lower leg: Edema present.     Left lower leg: Edema present.     Comments: 2+ pitting edema b/l ankles  Skin:    General: Skin is warm and dry.  Neurological:     Mental Status: She is alert and oriented to person, place, and time.     Comments: Fluent speech  Psychiatric:        Judgment: Judgment normal.      ED Treatments / Results  Labs (all labs ordered are listed, but only abnormal results are displayed) Labs Reviewed  BASIC METABOLIC PANEL - Abnormal; Notable for the following components:      Result Value   CO2 17 (*)    Glucose, Bld 106 (*)    BUN 65 (*)    Creatinine, Ser 6.26 (*)    GFR calc non Af Amer 6 (*)    GFR calc Af Amer 7 (*)    All other components within normal limits  CBC WITH DIFFERENTIAL/PLATELET - Abnormal; Notable for the following components:   RBC 2.52 (*)    Hemoglobin 7.7 (*)    HCT 25.4 (*)    MCV 100.8 (*)    Abs Immature Granulocytes 0.08 (*)    All other components within normal limits  TYPE AND SCREEN  ABO/RH    EKG None  Radiology No results found.  Procedures Procedures (including  critical care time)  Medications Ordered in ED Medications - No data to display   Initial Impression / Assessment and Plan / ED Course  I have reviewed the triage vital signs and the nursing notes.  Pertinent labs & imaging results that were available during my care of the patient were reviewed by me and considered in my medical decision making (see chart for details).       Comfortable and pleasant on exam, reassuring vital signs. Hgb here is 7.7.  She has no symptoms suggestive of severe anemia and no signs of acute blood loss or vital sign instability due to her anemia.  I have recommended that she follow back up with Dr. Alen Blew, who has seen her previously for anemia of chronic disease.  She is comfortable with discharge without blood transfusion today.  She understands that her kidney function is worsening on today's lab work and continues to request no further steps towards dialysis.  Return precautions reviewed. Final Clinical Impressions(s) / ED Diagnoses   Final diagnoses:  Anemia of chronic disease    ED Discharge Orders    None       Obelia Bonello, Wenda Overland, MD 09/23/18 1112

## 2018-09-23 NOTE — ED Notes (Signed)
Attempted to give report to [facility] but RN there is unable to take report. Told [place] to call back for any questions.

## 2018-09-23 NOTE — ED Notes (Signed)
Patient verbalizes understanding of discharge instructions. Opportunity for questioning and answers were provided. Armband removed by staff, pt discharged from ED.  

## 2018-09-23 NOTE — ED Notes (Addendum)
Pure[employer]ick in place.

## 2018-09-23 NOTE — ED Triage Notes (Signed)
[facility] with c/o low hgb , 6.1 history of same , pt only complaint is sob

## 2018-09-30 ENCOUNTER — Non-Acute Institutional Stay: Payer: Medicare Other | Admitting: Internal Medicine

## 2018-10-01 ENCOUNTER — Other Ambulatory Visit: Payer: Self-pay

## 2018-11-05 ENCOUNTER — Non-Acute Institutional Stay: Payer: Medicare Other | Admitting: Internal Medicine

## 2018-12-31 ENCOUNTER — Non-Acute Institutional Stay: Payer: Medicare Other | Admitting: Internal Medicine

## 2019-01-01 ENCOUNTER — Other Ambulatory Visit: Payer: Self-pay

## 2019-02-12 ENCOUNTER — Other Ambulatory Visit: Payer: Self-pay

## 2019-02-12 ENCOUNTER — Encounter (HOSPITAL_COMMUNITY): Payer: Self-pay

## 2019-02-12 ENCOUNTER — Emergency Department (HOSPITAL_COMMUNITY)
Admission: EM | Admit: 2019-02-12 | Discharge: 2019-02-12 | Disposition: A | Discharge status: Home / Self Care | Payer: Medicare Other | Attending: Emergency Medicine | Admitting: Emergency Medicine

## 2019-02-12 ENCOUNTER — Emergency Department (HOSPITAL_BASED_OUTPATIENT_CLINIC_OR_DEPARTMENT_OTHER)
Admission: EM | Admit: 2019-02-12 | Discharge: 2019-02-12 | Disposition: A | Discharge status: Home / Self Care | Payer: Medicare Other | Source: Home / Self Care | Attending: Emergency Medicine | Admitting: Emergency Medicine

## 2019-02-12 DIAGNOSIS — R52 Pain, unspecified: Secondary | ICD-10-CM

## 2019-02-12 DIAGNOSIS — N189 Chronic kidney disease, unspecified: Secondary | ICD-10-CM | POA: Diagnosis not present

## 2019-02-12 DIAGNOSIS — M7989 Other specified soft tissue disorders: Secondary | ICD-10-CM | POA: Diagnosis not present

## 2019-02-12 DIAGNOSIS — Z79899 Other long term (current) drug therapy: Secondary | ICD-10-CM | POA: Insufficient documentation

## 2019-02-12 DIAGNOSIS — Z87891 Personal history of nicotine dependence: Secondary | ICD-10-CM | POA: Diagnosis not present

## 2019-02-12 DIAGNOSIS — E039 Hypothyroidism, unspecified: Secondary | ICD-10-CM | POA: Diagnosis not present

## 2019-02-12 DIAGNOSIS — M62838 Other muscle spasm: Secondary | ICD-10-CM | POA: Diagnosis not present

## 2019-02-12 DIAGNOSIS — M79604 Pain in right leg: Secondary | ICD-10-CM | POA: Diagnosis present

## 2019-02-12 DIAGNOSIS — N179 Acute kidney failure, unspecified: Secondary | ICD-10-CM | POA: Insufficient documentation

## 2019-02-12 DIAGNOSIS — I129 Hypertensive chronic kidney disease with stage 1 through stage 4 chronic kidney disease, or unspecified chronic kidney disease: Secondary | ICD-10-CM | POA: Insufficient documentation

## 2019-02-12 LAB — CBC WITH DIFFERENTIAL/PLATELET
Abs Immature Granulocytes: 0.09 10*3/uL — ABNORMAL HIGH (ref 0.00–0.07)
Basophils Absolute: 0 10*3/uL (ref 0.0–0.1)
Basophils Relative: 0 %
Eosinophils Absolute: 0.6 10*3/uL — ABNORMAL HIGH (ref 0.0–0.5)
Eosinophils Relative: 7 %
HCT: 27 % — ABNORMAL LOW (ref 36.0–46.0)
Hemoglobin: 8.2 g/dL — ABNORMAL LOW (ref 12.0–15.0)
Immature Granulocytes: 1 %
Lymphocytes Relative: 31 %
Lymphs Abs: 2.7 10*3/uL (ref 0.7–4.0)
MCH: 30.9 pg (ref 26.0–34.0)
MCHC: 30.4 g/dL (ref 30.0–36.0)
MCV: 101.9 fL — ABNORMAL HIGH (ref 80.0–100.0)
Monocytes Absolute: 1 10*3/uL (ref 0.1–1.0)
Monocytes Relative: 11 %
Neutro Abs: 4.4 10*3/uL (ref 1.7–7.7)
Neutrophils Relative %: 50 %
Platelets: 260 10*3/uL (ref 150–400)
RBC: 2.65 MIL/uL — ABNORMAL LOW (ref 3.87–5.11)
RDW: 16.6 % — ABNORMAL HIGH (ref 11.5–15.5)
WBC: 8.8 10*3/uL (ref 4.0–10.5)
nRBC: 0.3 % — ABNORMAL HIGH (ref 0.0–0.2)

## 2019-02-12 LAB — BASIC METABOLIC PANEL
Anion gap: 12 (ref 5–15)
BUN: 68 mg/dL — ABNORMAL HIGH (ref 8–23)
CO2: 15 mmol/L — ABNORMAL LOW (ref 22–32)
Calcium: 8.5 mg/dL — ABNORMAL LOW (ref 8.9–10.3)
Chloride: 112 mmol/L — ABNORMAL HIGH (ref 98–111)
Creatinine, Ser: 8.11 mg/dL — ABNORMAL HIGH (ref 0.44–1.00)
GFR calc Af Amer: 5 mL/min — ABNORMAL LOW (ref >60)
GFR calc non Af Amer: 4 mL/min — ABNORMAL LOW (ref >60)
Glucose, Bld: 85 mg/dL (ref 70–99)
Potassium: 4.8 mmol/L (ref 3.5–5.1)
Sodium: 139 mmol/L (ref 135–145)

## 2019-02-12 MED ORDER — FENTANYL CITRATE (PF) 100 MCG/2ML IJ SOLN
50.0000 ug | Freq: Once | INTRAMUSCULAR | Status: AC
  Administered 2019-02-12: 50 ug via INTRAVENOUS
  Filled 2019-02-12: qty 2

## 2019-02-12 NOTE — ED Provider Notes (Signed)
[facility] DEPT Provider Note   CSN: [id number] Arrival date & time: 02/12/19  1143     History   Chief Complaint No chief complaint on file. Leg cramps  HPI Kenley Hoxit is a 78 y.o. female.  She is brought in by ambulance from [facility] for evaluation of spasms in her right leg over the past 3 to 4 weeks.  She said she had a pin placed in that leg years ago and it is giving her problems since then.  She is generally wheelchair-bound.  She said she fell about a year ago.  No recent falls.  It sounds like the facility has been trying her on some pain medicine without any improvement in her symptoms.  She denies any chest pain or shortness of breath.     The history is provided by the patient.  Leg Pain Location:  Leg Leg location:  R leg, R upper leg and R lower leg Pain details:    Quality:  Cramping   Radiates to:  Groin   Severity:  Moderate   Onset quality:  Gradual   Timing:  Intermittent   Progression:  Worsening Relieved by:  Nothing Worsened by:  Nothing Ineffective treatments: tramadol. Associated symptoms: no fever     Past Medical History:  Diagnosis Date  . Anemia   . Chronic kidney disease    secondary hyperparathyroidism  . GERD (gastroesophageal reflux disease)   . Gout, unspecified   . Hyperlipidemia   . Hypertension   . Hypothyroid   . Monoclonal gammopathy   . Obesity   . Osteoarthritis   . Secondary hyperparathyroidism [facility])     Patient Active Problem List   Diagnosis Date Noted  . Hypothyroidism 12/25/2016  . Essential hypertension 07/09/2016  . Chronic constipation 07/09/2016  . Multinodular goiter 05/29/2016  . Closed right ankle fracture 11/14/2015  . Anemia of chronic disease 10/12/2015    Past Surgical History:  Procedure Laterality Date  . BACK SURGERY    . COLONOSCOPY W/ POLYPECTOMY    . ORIF ANKLE FRACTURE Right 11/14/2015   Procedure: OPEN REDUCTION INTERNAL FIXATION (ORIF) RIGHT  ANKLE FRACTURE;  Surgeon: Renette Butters, MD;  Location: [facility];  Service: Orthopedics;  Laterality: Right;     OB History   No obstetric history on file.      Home Medications    Prior to Admission medications   Medication Sig Start Date End Date Taking? Authorizing Provider  acetaminophen (TYLENOL) 500 MG tablet Take 1 tablet (500 mg total) by mouth every 4 (four) hours as needed for mild pain or moderate pain. Patient taking differently: Take 1,000 mg by mouth 3 (three) times daily.  04/27/16  Yes Antonietta Breach, PA-C  allopurinol (ZYLOPRIM) 100 MG tablet Take 100 mg by mouth 2 (two) times daily.  08/26/12  Yes [provider]  cinacalcet (SENSIPAR) 30 MG tablet Take 30 mg by mouth daily.   Yes [provider]  lidocaine (LMX) 4 % cream Apply 1 application topically 3 (three) times daily.   Yes [provider]  Menthol, Topical Analgesic, (BIOFREEZE) 4 % GEL Apply 1 application topically 3 (three) times daily. Right knee   Yes [provider]  amLODipine (NORVASC) 5 MG tablet Take 5 mg by mouth every evening.     [provider]  atorvastatin (LIPITOR) 20 MG tablet Take 20 mg by mouth every evening.  08/26/12   [provider]  bisacodyl (DULCOLAX) 10 MG suppository Place  10 mg rectally as needed for moderate constipation.    [provider]  calcitRIOL (ROCALTROL) 0.25 MCG capsule Take 0.25 mcg by mouth daily.    [provider]  calcium carbonate (TUMS - DOSED IN MG ELEMENTAL CALCIUM) 500 MG chewable tablet Chew 2 tablets by mouth every 4 (four) hours as needed for indigestion or heartburn.    [provider]  Cholecalciferol (VITAMIN D3) 1.25 MG (50000 UT) CAPS Take 1 capsule by mouth every 30 (thirty) days.    [provider]  cloNIDine (CATAPRES) 0.1 MG tablet Take 0.1 mg by mouth daily.     [provider]  cyclobenzaprine (FLEXERIL) 5 MG tablet Take 5 mg by mouth at bedtime. 02/05/19    [provider]  Darbepoetin Alfa (ARANESP) 200 MCG/0.4ML SOSY injection Inject 200 mcg into the skin every 14 (fourteen) days.     [provider]  epoetin alfa-epbx (RETACRIT) 02725 UNIT/ML injection Inject 10,000 Units into the skin every 14 (fourteen) days.    [provider]  ferrous sulfate 325 (65 FE) MG tablet Take 325 mg by mouth 2 (two) times daily with a meal.    [provider]  furosemide (LASIX) 80 MG tablet Take 80 mg by mouth daily.    [provider]  hydrALAZINE (APRESOLINE) 100 MG tablet Take 100 mg by mouth 3 (three) times daily.     [provider]  ipratropium-albuterol (DUONEB) 0.5-2.5 (3) MG/3ML SOLN Take 3 mLs by nebulization every 4 (four) hours as needed (shortness or breath).    [provider]  levothyroxine (SYNTHROID, LEVOTHROID) 75 MCG tablet Take 75 mcg by mouth daily before breakfast.    [provider]  Magnesium 200 MG TABS Take 2 tablets by mouth daily.    [provider]  metoprolol (LOPRESSOR) 50 MG tablet Take 50 mg by mouth 2 (two) times daily.     [provider]  mirtazapine (REMERON) 7.5 MG tablet Take 7.5 mg by mouth at bedtime.    [provider]  omeprazole (PRILOSEC) 20 MG capsule Take 20 mg by mouth daily.    [provider]  ondansetron (ZOFRAN) 4 MG tablet Take 4 mg by mouth every 4 (four) hours as needed for nausea or vomiting.    [provider]  polyethylene glycol (MIRALAX / GLYCOLAX) packet Take 17 g by mouth 2 (two) times daily.     [provider]  sennosides-docusate sodium (SENOKOT-S) 8.6-50 MG tablet Take 2 tablets by mouth 2 (two) times daily.    [provider]  traMADol (ULTRAM) 50 MG tablet Take 50 mg by mouth every 4 (four) hours as needed for moderate pain or severe pain.     [provider]  trolamine salicylate (ASPERCREME) 10 % cream Apply 1 application topically 3 (three) times daily as  needed for muscle pain.    [provider]  vitamin C (ASCORBIC ACID) 500 MG tablet Take 500 mg by mouth daily.    [provider]    Family History Family History  Problem Relation Age of Onset  . Thyroid disease Neg Hx   . Breast cancer Neg Hx     Social History Social History   Tobacco Use  . Smoking status: Former Smoker    Packs/day: 0.25    Years: 2.00    Pack years: 0.50  . Smokeless tobacco: Never Used  Substance Use Topics  . Alcohol use: No  . Drug use: No  Allergies   Hydrocodone bitart (antituss) [hydrocodone], Lyrica [pregabalin], and Penicillins   Review of Systems Review of Systems  Constitutional: Negative for fever.  HENT: Negative for sore throat.   Eyes: Negative for visual disturbance.  Respiratory: Negative for shortness of breath.   Cardiovascular: Negative for chest pain.  Gastrointestinal: Negative for abdominal pain.  Genitourinary: Negative for dysuria.  Musculoskeletal: Positive for myalgias.  Skin: Negative for rash.  Neurological: Negative for headaches.     Physical Exam Updated Vital Signs BP (!) 177/85 (BP Location: Right Arm)   Pulse 79   Temp 98.6 F (37 C) (Oral)   Resp 16   SpO2 100%   Physical Exam Vitals signs and nursing note reviewed.  Constitutional:      General: She is not in acute distress.    Appearance: She is well-developed.  HENT:     Head: Normocephalic and atraumatic.  Eyes:     Conjunctiva/sclera: Conjunctivae normal.  Neck:     Musculoskeletal: Neck supple.  Cardiovascular:     Rate and Rhythm: Normal rate and regular rhythm.     Heart sounds: No murmur.  Pulmonary:     Effort: Pulmonary effort is normal. No respiratory distress.     Breath sounds: Normal breath sounds.  Abdominal:     Palpations: Abdomen is soft.     Tenderness: There is no abdominal tenderness.  Musculoskeletal:        General: Tenderness (diffuse right leg) present. No deformity or signs of injury.      Right lower leg: Edema present.     Left lower leg: Edema (symmetric) present.  Skin:    General: Skin is warm and dry.     Capillary Refill: Capillary refill takes less than 2 seconds.  Neurological:     Mental Status: She is alert. Mental status is at baseline.      ED Treatments / Results  Labs (all labs ordered are listed, but only abnormal results are displayed) Labs Reviewed  BASIC METABOLIC PANEL - Abnormal; Notable for the following components:      Result Value   Chloride 112 (*)    CO2 15 (*)    BUN 68 (*)    Creatinine, Ser 8.11 (*)    Calcium 8.5 (*)    GFR calc non Af Amer 4 (*)    GFR calc Af Amer 5 (*)    All other components within normal limits  CBC WITH DIFFERENTIAL/PLATELET - Abnormal; Notable for the following components:   RBC 2.65 (*)    Hemoglobin 8.2 (*)    HCT 27.0 (*)    MCV 101.9 (*)    RDW 16.6 (*)    nRBC 0.3 (*)    Eosinophils Absolute 0.6 (*)    Abs Immature Granulocytes 0.09 (*)    All other components within normal limits    EKG None  Radiology Vas [place] Lower Extremity Venous (dvt) (only Mc & Wl 7a-7p)  Result Date: 02/12/2019  Lower Venous Study Indications: Swelling, and Pain.  Risk Factors: None identified. Limitations: Body habitus and poor ultrasound/tissue interface. Comparison Study: No prior studies. Performing Technologist: Oliver Hum RVT  Examination Guidelines: A complete evaluation includes B-mode imaging, spectral Doppler, color Doppler, and power Doppler as needed of all accessible portions of each vessel. Bilateral testing is considered an integral part of a complete examination. Limited examinations for reoccurring indications may be performed as noted.  +---------+---------------+---------+-----------+----------+--------------+ RIGHT    CompressibilityPhasicitySpontaneityPropertiesThrombus Aging +---------+---------------+---------+-----------+----------+--------------+ CFV  Full           Yes      Yes                                  +---------+---------------+---------+-----------+----------+--------------+ SFJ      Full                                                        +---------+---------------+---------+-----------+----------+--------------+ FV Prox  Full                                                        +---------+---------------+---------+-----------+----------+--------------+ FV Mid   Full                                                        +---------+---------------+---------+-----------+----------+--------------+ FV DistalFull                                                        +---------+---------------+---------+-----------+----------+--------------+ PFV      Full                                                        +---------+---------------+---------+-----------+----------+--------------+ POP      Full           Yes      Yes                                 +---------+---------------+---------+-----------+----------+--------------+ PTV      Full                                                        +---------+---------------+---------+-----------+----------+--------------+ PERO     Full                                                        +---------+---------------+---------+-----------+----------+--------------+   +---------+---------------+---------+-----------+----------+--------------+ LEFT     CompressibilityPhasicitySpontaneityPropertiesThrombus Aging +---------+---------------+---------+-----------+----------+--------------+ CFV      Full           Yes      Yes                                 +---------+---------------+---------+-----------+----------+--------------+  SFJ      Full                                                        +---------+---------------+---------+-----------+----------+--------------+ FV Prox  Full                                                         +---------+---------------+---------+-----------+----------+--------------+ FV Mid   Full                                                        +---------+---------------+---------+-----------+----------+--------------+ FV DistalFull                                                        +---------+---------------+---------+-----------+----------+--------------+ PFV      Full                                                        +---------+---------------+---------+-----------+----------+--------------+ POP      Full           Yes      Yes                                 +---------+---------------+---------+-----------+----------+--------------+ PTV      Full                                                        +---------+---------------+---------+-----------+----------+--------------+ PERO     Full                                                        +---------+---------------+---------+-----------+----------+--------------+     Summary: Right: There is no evidence of deep vein thrombosis in the lower extremity. No cystic structure found in the popliteal fossa. Left: No evidence of common femoral vein obstruction.  *See table(s) above for measurements and observations.    Preliminary     Procedures Procedures (including critical care time)  Medications Ordered in ED Medications  fentaNYL (SUBLIMAZE) injection 50 mcg (50 mcg Intravenous Given 02/12/19 1435)     Initial Impression / Assessment and Plan / ED Course  I have reviewed the triage vital signs and the nursing notes.  Pertinent labs & imaging results that were available during my care of  the patient were reviewed by me and considered in my medical decision making (see chart for details).  Clinical Course as of Feb 11 1757  Fri Feb 12, 5239  5423 78 year old female with right leg pain and spasms for something like a long time but worse over the past month.  She is complaining of intermittent  cramps that keep her from sleeping.  Differential includes DVT, metabolic derangement, occult fracture.    [MB]  1321 Duplex preliminary negative for DVT.   [MB]  [id number] Patient's labs significant for anemia but at baseline.  Her creatinine is more elevated than the last time they checked a few months ago.  I asked the patient about this and she said they talk to her about dialysis 4 years ago and she declined it at that time.  I recommended to her that we send her back to her facility and I will make some recommendations on reconnecting with her nephrologist and also saying that they work on keeping her comfortable if she is trending towards [place].   [MB]    Clinical Course User Index [MB] Hayden Rasmussen, MD       Final Clinical Impressions(s) / ED Diagnoses   Final diagnoses:  Muscle spasm of right leg  Acute renal failure superimposed on chronic kidney disease, unspecified CKD stage, unspecified acute renal failure type [facility])    ED Discharge Orders    None       Hayden Rasmussen, MD 02/12/19 1759

## 2019-02-12 NOTE — ED Notes (Signed)
Son of patient, Dola Factor wants RN to call him so he can give you information. [phone].

## 2019-02-12 NOTE — Discharge Instructions (Addendum)
You were seen in the emergency department for worsening leg pain and spasms on the right.  You had an ultrasound that did not show any sign of a blood clot.  Your blood work showed that your renal function had worsened.  It would be important to reconnect with your nephrologist and also for the facility to help you with better pain control.  Return to the emergency department if any worsening symptoms.

## 2019-02-12 NOTE — ED Triage Notes (Signed)
Pt arrived via EMS from [facility] c/o chronic R leg muscle spasms that have gotten worse the last 3-4 weeks. Patient told EMS that "the medicines she is given at the facility do not help with the pain." Patient has not taken BP medication today.

## 2019-02-12 NOTE — ED Notes (Signed)
Patient son update about patient condition. Patient will be discharge back to [facility]. Attempted to call report but no one answer.

## 2019-02-12 NOTE — Progress Notes (Signed)
Right lower extremity venous duplex has been completed. Preliminary results can be found in CV Proc through chart review.  Results were given to Dr. Melina Copa.  02/12/19 1:14 PM Carlos Levering RVT

## 2019-02-12 NOTE — ED Notes (Signed)
I gave patient  A sandwich and a drink per nurse

## 2019-03-31 ENCOUNTER — Emergency Department (HOSPITAL_COMMUNITY)
Admission: EM | Admit: 2019-03-31 | Discharge: 2019-04-01 | Disposition: A | Discharge status: Home / Self Care | Payer: Medicare Other | Attending: Emergency Medicine | Admitting: Emergency Medicine

## 2019-03-31 ENCOUNTER — Other Ambulatory Visit: Payer: Self-pay

## 2019-03-31 ENCOUNTER — Encounter (HOSPITAL_COMMUNITY): Payer: Self-pay | Admitting: Emergency Medicine

## 2019-03-31 DIAGNOSIS — N189 Chronic kidney disease, unspecified: Secondary | ICD-10-CM | POA: Insufficient documentation

## 2019-03-31 DIAGNOSIS — E039 Hypothyroidism, unspecified: Secondary | ICD-10-CM | POA: Insufficient documentation

## 2019-03-31 DIAGNOSIS — D649 Anemia, unspecified: Secondary | ICD-10-CM | POA: Insufficient documentation

## 2019-03-31 DIAGNOSIS — Z79899 Other long term (current) drug therapy: Secondary | ICD-10-CM | POA: Diagnosis not present

## 2019-03-31 DIAGNOSIS — I129 Hypertensive chronic kidney disease with stage 1 through stage 4 chronic kidney disease, or unspecified chronic kidney disease: Secondary | ICD-10-CM | POA: Insufficient documentation

## 2019-03-31 LAB — CBC WITH DIFFERENTIAL/PLATELET
Abs Immature Granulocytes: 0.21 10*3/uL — ABNORMAL HIGH (ref 0.00–0.07)
Basophils Absolute: 0 10*3/uL (ref 0.0–0.1)
Basophils Relative: 0 %
Eosinophils Absolute: 0.5 10*3/uL (ref 0.0–0.5)
Eosinophils Relative: 4 %
HCT: 26.9 % — ABNORMAL LOW (ref 36.0–46.0)
Hemoglobin: 8.1 g/dL — ABNORMAL LOW (ref 12.0–15.0)
Immature Granulocytes: 2 %
Lymphocytes Relative: 25 %
Lymphs Abs: 2.8 10*3/uL (ref 0.7–4.0)
MCH: 31.9 pg (ref 26.0–34.0)
MCHC: 30.1 g/dL (ref 30.0–36.0)
MCV: 105.9 fL — ABNORMAL HIGH (ref 80.0–100.0)
Monocytes Absolute: 1.1 10*3/uL — ABNORMAL HIGH (ref 0.1–1.0)
Monocytes Relative: 10 %
Neutro Abs: 6.7 10*3/uL (ref 1.7–7.7)
Neutrophils Relative %: 59 %
Platelets: 287 10*3/uL (ref 150–400)
RBC: 2.54 MIL/uL — ABNORMAL LOW (ref 3.87–5.11)
RDW: 15.3 % (ref 11.5–15.5)
WBC: 11.4 10*3/uL — ABNORMAL HIGH (ref 4.0–10.5)
nRBC: 3.2 % — ABNORMAL HIGH (ref 0.0–0.2)

## 2019-03-31 LAB — TYPE AND SCREEN
ABO/RH(D): O POS
Antibody Screen: NEGATIVE

## 2019-03-31 LAB — COMPREHENSIVE METABOLIC PANEL
ALT: 10 U/L (ref 0–44)
AST: 16 U/L (ref 15–41)
Albumin: 3.3 g/dL — ABNORMAL LOW (ref 3.5–5.0)
Alkaline Phosphatase: 65 U/L (ref 38–126)
Anion gap: 14 (ref 5–15)
BUN: 73 mg/dL — ABNORMAL HIGH (ref 8–23)
CO2: 12 mmol/L — ABNORMAL LOW (ref 22–32)
Calcium: 9.3 mg/dL (ref 8.9–10.3)
Chloride: 111 mmol/L (ref 98–111)
Creatinine, Ser: 8.57 mg/dL — ABNORMAL HIGH (ref 0.44–1.00)
GFR calc Af Amer: 5 mL/min — ABNORMAL LOW (ref >60)
GFR calc non Af Amer: 4 mL/min — ABNORMAL LOW (ref >60)
Glucose, Bld: 131 mg/dL — ABNORMAL HIGH (ref 70–99)
Potassium: 4.6 mmol/L (ref 3.5–5.1)
Sodium: 137 mmol/L (ref 135–145)
Total Bilirubin: 0.5 mg/dL (ref 0.3–1.2)
Total Protein: 7.2 g/dL (ref 6.5–8.1)

## 2019-03-31 NOTE — ED Triage Notes (Addendum)
Pt arrives via ems from [facility] with hgb of 6.8 labs drawn yesterday by dr was sent for transfusion, pt states that her stools have been dark but she is on iron

## 2019-03-31 NOTE — ED Notes (Signed)
Pt unable to ambulate. 

## 2019-04-01 DIAGNOSIS — D649 Anemia, unspecified: Secondary | ICD-10-CM | POA: Diagnosis not present

## 2019-04-01 LAB — CBC
HCT: 29.8 % — ABNORMAL LOW (ref 36.0–46.0)
Hemoglobin: 9.2 g/dL — ABNORMAL LOW (ref 12.0–15.0)
MCH: 31.9 pg (ref 26.0–34.0)
MCHC: 30.9 g/dL (ref 30.0–36.0)
MCV: 103.5 fL — ABNORMAL HIGH (ref 80.0–100.0)
Platelets: 326 10*3/uL (ref 150–400)
RBC: 2.88 MIL/uL — ABNORMAL LOW (ref 3.87–5.11)
RDW: 15.6 % — ABNORMAL HIGH (ref 11.5–15.5)
WBC: 13 10*3/uL — ABNORMAL HIGH (ref 4.0–10.5)
nRBC: 3.9 % — ABNORMAL HIGH (ref 0.0–0.2)

## 2019-04-01 LAB — PATHOLOGIST SMEAR REVIEW: Path Review: INCREASED

## 2019-04-01 LAB — POC OCCULT BLOOD, ED: Fecal Occult Bld: NEGATIVE

## 2019-04-01 NOTE — ED Provider Notes (Signed)
Bonnie Alvarez   CSN: [id number] Arrival date & time: 03/31/19  1542     History Chief Complaint  Patient presents with  . Anemia    Bonnie Alvarez is a 79 y.o. female.   Anemia This is a recurrent problem. The problem occurs constantly. Pertinent negatives include no chest pain and no abdominal pain. Nothing aggravates the symptoms. Nothing relieves the symptoms.  patient with h/o chronic anemia, chronic kidney disease presents for concern for anemia Pt had labs drawn yesterday that revealed HB of 6.8 Pt sent for evaluation and transfusion Pt reports generalized weakness She reports dark stool due to iron tablets No other acute complaints     Past Medical History:  Diagnosis Date  . Anemia   . Chronic kidney disease    secondary hyperparathyroidism  . GERD (gastroesophageal reflux disease)   . Gout, unspecified   . Hyperlipidemia   . Hypertension   . Hypothyroid   . Monoclonal gammopathy   . Obesity   . Osteoarthritis   . Secondary hyperparathyroidism [facility])     Patient Active Problem List   Diagnosis Date Noted  . Hypothyroidism 12/25/2016  . Essential hypertension 07/09/2016  . Chronic constipation 07/09/2016  . Multinodular goiter 05/29/2016  . Closed right ankle fracture 11/14/2015  . Anemia of chronic disease 10/12/2015    Past Surgical History:  Procedure Laterality Date  . BACK SURGERY    . COLONOSCOPY W/ POLYPECTOMY    . ORIF ANKLE FRACTURE Right 11/14/2015   Procedure: OPEN REDUCTION INTERNAL FIXATION (ORIF) RIGHT ANKLE FRACTURE;  Surgeon: Renette Butters, MD;  Location: [facility];  Service: Orthopedics;  Laterality: Right;     OB History   No obstetric history on file.     Family History  Problem Relation Age of Onset  . Thyroid disease Neg Hx   . Breast cancer Neg Hx     Social History   Tobacco Use  . Smoking status: Former Smoker    Packs/day: 0.25    Years: 2.00    Pack years:  0.50  . Smokeless tobacco: Never Used  Substance Use Topics  . Alcohol use: No  . Drug use: No    Home Medications Prior to Admission medications   Medication Sig Start Date End Date Taking? Authorizing Provider  acetaminophen (TYLENOL) 500 MG tablet Take 1 tablet (500 mg total) by mouth every 4 (four) hours as needed for mild pain or moderate pain. Patient taking differently: Take 1,000 mg by mouth See admin instructions. 1000mg  TID. Also 500mg  q4h prn pain 04/27/16   Antonietta Breach, PA-C  allopurinol (ZYLOPRIM) 100 MG tablet Take 100 mg by mouth 2 (two) times daily.  08/26/12   [provider]  amLODipine (NORVASC) 5 MG tablet Take 7.5 mg by mouth every evening.     [provider]  atorvastatin (LIPITOR) 20 MG tablet Take 20 mg by mouth every evening.  08/26/12   [provider]  bisacodyl (DULCOLAX) 10 MG suppository Place 10 mg rectally daily as needed for moderate constipation.     [provider]  calcium carbonate (TUMS - DOSED IN MG ELEMENTAL CALCIUM) 500 MG chewable tablet Chew 2 tablets by mouth every 4 (four) hours as needed for indigestion.     [provider]  Cholecalciferol (VITAMIN D3) 1.25 MG (50000 UT) CAPS Take 1.25 mg by mouth every 30 (thirty) days.     [provider]  cinacalcet (SENSIPAR) 30 MG tablet Take  30 mg by mouth daily.    [provider]  cyclobenzaprine (FLEXERIL) 5 MG tablet Take 5 mg by mouth at bedtime. 02/05/19   [provider]  epoetin alfa-epbx (RETACRIT) 91478 UNIT/ML injection Inject 10,000 Units into the skin every 14 (fourteen) days.    [provider]  ferrous sulfate 325 (65 FE) MG tablet Take 325 mg by mouth 2 (two) times daily with a meal.    [provider]  furosemide (LASIX) 80 MG tablet Take 80 mg by mouth daily.    [provider]  hydrALAZINE (APRESOLINE) 100 MG tablet Take 100 mg by mouth 3 (three) times daily.     [provider]    ipratropium-albuterol (DUONEB) 0.5-2.5 (3) MG/3ML SOLN Take 3 mLs by nebulization every 4 (four) hours as needed (shortness or breath/wheezing).     [provider]  levothyroxine (SYNTHROID, LEVOTHROID) 75 MCG tablet Take 75 mcg by mouth daily before breakfast.    [provider]  lidocaine (LMX) 4 % cream Apply 1 application topically 3 (three) times daily.    [provider]  Magnesium 200 MG TABS Take 400 mg by mouth daily.     [provider]  Menthol, Topical Analgesic, (BIOFREEZE) 4 % GEL Apply 1 application topically 3 (three) times daily. Right knee    [provider]  metoprolol succinate (TOPROL-XL) 100 MG 24 hr tablet Take 100 mg by mouth daily. 02/05/19   [provider]  mirtazapine (REMERON) 7.5 MG tablet Take 7.5 mg by mouth at bedtime.    [provider]  omeprazole (PRILOSEC) 20 MG capsule Take 20 mg by mouth daily.    [provider]  ondansetron (ZOFRAN) 4 MG tablet Take 4 mg by mouth every 4 (four) hours as needed for nausea.     [provider]  polyethylene glycol (MIRALAX / GLYCOLAX) packet Take 17 g by mouth 2 (two) times daily.     [provider]  sennosides-docusate sodium (SENOKOT-S) 8.6-50 MG tablet Take 2 tablets by mouth 2 (two) times daily.    [provider]  traZODone (DESYREL) 50 MG tablet Take 25 mg by mouth at bedtime. 02/09/19   [provider]  vitamin C (ASCORBIC ACID) 500 MG tablet Take 500 mg by mouth daily.    [provider]    Allergies    Caduet [amlodipine-atorvastatin], Hydrocodone bitart (antituss) [hydrocodone], Lyrica [pregabalin], and Penicillins  Review of Systems   Review of Systems  Constitutional: Positive for fatigue. Negative for fever.  Cardiovascular: Negative for chest pain.  Gastrointestinal: Negative for abdominal pain and vomiting.       Dark stool  Neurological: Positive for weakness.  All other systems  reviewed and are negative.   Physical Exam Updated Vital Signs BP (!) 148/77 (BP Location: Left Arm)   Pulse 98   Temp 97.6 F (36.4 C) (Oral)   Resp 19   SpO2 98%   Physical Exam CONSTITUTIONAL: Chronically ill-appearing, no acute distress HEAD: Normocephalic/atraumatic EYES: EOMI/PERRL ENMT: Mucous membranes moist NECK: supple no meningeal signs SPINE/BACK:entire spine nontender CV: S1/S2 noted LUNGS: Lungs are clear to auscultation bilaterally, no apparent distress ABDOMEN: soft, nontender Rectal-dark stool, no blood, no rectal mass.  Hemoccult negative.  Chaperone present for exam NEURO: Pt is awake/alert/appropriate, moves all extremitiesx4. EXTREMITIES: pulses normal/equal, full ROM SKIN: warm, color normal PSYCH: no abnormalities of mood noted, alert and oriented to situation  ED Results / Procedures / Treatments   Labs (all labs  ordered are listed, but only abnormal results are displayed) Labs Reviewed  CBC WITH DIFFERENTIAL/PLATELET - Abnormal; Notable for the following components:      Result Value   WBC 11.4 (*)    RBC 2.54 (*)    Hemoglobin 8.1 (*)    HCT 26.9 (*)    MCV 105.9 (*)    nRBC 3.2 (*)    Monocytes Absolute 1.1 (*)    Abs Immature Granulocytes 0.21 (*)    All other components within normal limits  COMPREHENSIVE METABOLIC PANEL - Abnormal; Notable for the following components:   CO2 12 (*)    Glucose, Bld 131 (*)    BUN 73 (*)    Creatinine, Ser 8.57 (*)    Albumin 3.3 (*)    GFR calc non Af Amer 4 (*)    GFR calc Af Amer 5 (*)    All other components within normal limits  CBC - Abnormal; Notable for the following components:   WBC 13.0 (*)    RBC 2.88 (*)    Hemoglobin 9.2 (*)    HCT 29.8 (*)    MCV 103.5 (*)    RDW 15.6 (*)    nRBC 3.9 (*)    All other components within normal limits  PATHOLOGIST SMEAR REVIEW  POC OCCULT BLOOD, ED  TYPE AND SCREEN    EKG None  Radiology No results found.  Procedures Procedures     Medications Ordered in ED Medications - No data to display  ED Course  I have reviewed the triage vital signs and the nursing notes.  Pertinent labs  results that were available during my care of the patient were reviewed by me and considered in my medical decision making (see chart for details).    MDM Rules/Calculators/A&P                      12:31 AM Patient stable.  Initial hemoglobin is unchanged.  Labs near baseline.  Patient's been here several hours, will recheck hemoglobin. if Negative will be discharged back to facility.  Attempted to call her [facility] but was unable to reach a nurse 2:06 AM Repeat labs reassuring.  Patient otherwise stable for discharge back to her facility.  No other acute issues at this time Final Clinical Impression(s) / ED Diagnoses Final diagnoses:  Chronic anemia    Rx / DC Orders ED Discharge Orders    None       Ripley Fraise, MD 04/01/19 0206

## 2019-04-01 NOTE — ED Notes (Signed)
[facility] called @ 155 to transport patient

## 2019-07-05 ENCOUNTER — Non-Acute Institutional Stay: Payer: Medicare Other | Admitting: Internal Medicine

## 2019-07-05 DIAGNOSIS — Z515 Encounter for palliative care: Secondary | ICD-10-CM

## 2019-07-05 NOTE — Progress Notes (Signed)
[facility] Consult Note Telephone: [phone]  Fax: [phone]  PATIENT NAME: Bonnie Alvarez DOB: September 29, 1940 MRN: [id number]  PRIMARY CARE PROVIDER:   Dr. Jules Husbands  REFERRING PROVIDER: Dr. Jules Husbands RESPONSIBLE PARTY:   Self    RECOMMENDATIONS and PLAN:  Palliative care encounter     Z51.5  1.  Advance care planning:  Code status and MOST forms are complete and on chart.  Pt discussed comfort measure and no desire to return to the hospital, have blood transfusions. No tube feedings. She does not ever plan on iinitiating hemodialysis. Comfort measures discussed which she is in favor of but does not want to transition to Hospice services. Offered to phone sone for updates but patient would not authorize this action.   Palliative care will f/u with patient.    2.  End stage renal disease:  GFR =5 per medical record review.  Refusal of hemodialysis or escalation of therapies. Consider transition to Hospice.  Supportive care.  3.  Pruitis:  Related to ESRD and uremia;  Amlactin lotion for improvement of dry skin.  Supportive care as needed.  I spent 45 minutes providing this consultation,  From 1400 to 1445. More than 50% of the time in this consultation was spent coordinating communicati HISTORY OF PRESENT ILLNESS: Follow-up with Bonnie Alvarez She reports feeling weaker and requires assistance with transfers to wheelchair  She has a  decreased appetite but eats meals from her family.  Stilll no plans for initiate dialysisPalliative Care was asked to help address goals of care.   CODE STATUS: DNAR  PPS: 30%  Wheelchair tran HOSPICE ELIGIBILITY/DIAGNOSIS: YES/End stage renal disease but eclined by patient.    PAST MEDICAL HISTORY:  Past Medical History:  Diagnosis Date  . Anemia   . Chronic kidney disease    secondary hyperparathyroidism  . GERD (gastroesophageal reflux disease)   . Gout, unspecified   . Hyperlipidemia   . Hypertension   . Hypothyroid   . Monoclonal gammopathy   . Obesity   . Osteoarthritis   . Secondary hyperparathyroidism ([place])          PERTINENT MEDICATIONS:  Outpatient Encounter Medications as of 07/05/2019  Medication Sig  . acetaminophen (TYLENOL) 500 MG tablet Take 1 tablet (500 mg total) by mouth every 4 (four) hours as needed for mild pain or moderate pain. (Patient taking differently: Take 1,000 mg by mouth See admin instructions. 1000mg  TID. Also 500mg  q4h prn pain)  . allopurinol (ZYLOPRIM) 100 MG tablet Take 100 mg by mouth 2 (two) times daily.   Marland Kitchen amLODipine (NORVASC) 5 MG tablet Take 7.5 mg by mouth every evening.   Marland Kitchen atorvastatin (LIPITOR) 20 MG tablet Take 20 mg by mouth every evening.   . bisacodyl (DULCOLAX) 10 MG suppository Place 10 mg rectally daily as needed for moderate constipation.   . calcium carbonate (TUMS - DOSED IN MG ELEMENTAL CALCIUM) 500 MG chewable tablet Chew 2 tablets by mouth every 4 (four) hours as needed for indigestion.   . Cholecalciferol (VITAMIN D3) 1.25 MG (50000 UT) CAPS Take 1.25 mg by mouth every 30 (thirty) days.   . cinacalcet (SENSIPAR) 30 MG tablet Take 30 mg by mouth daily.  . cyclobenzaprine (FLEXERIL) 5 MG tablet Take 5 mg by mouth at bedtime.  Marland Kitchen epoetin alfa-epbx (RETACRIT) 03474 UNIT/ML injection Inject 10,000 Units into the skin every 14 (fourteen) days.  . ferrous sulfate 325 (65 FE) MG tablet Take 325 mg by mouth 2 (two) times daily  with a meal.  . furosemide (LASIX) 80 MG tablet Take 80 mg by mouth daily.  . hydrALAZINE (APRESOLINE) 100 MG tablet Take 100 mg by mouth 3 (three) times daily.   Marland Kitchen ipratropium-albuterol (DUONEB) 0.5-2.5 (3) MG/3ML SOLN Take 3 mLs by nebulization every 4 (four) hours as needed (shortness or breath/wheezing).   Marland Kitchen levothyroxine (SYNTHROID, LEVOTHROID) 75 MCG tablet Take 75 mcg by mouth daily before breakfast.  . lidocaine (LMX) 4 % cream Apply 1 application topically 3 (three) times daily.  . Magnesium 200 MG TABS Take 400 mg by mouth daily.   . Menthol, Topical Analgesic, (BIOFREEZE) 4 % GEL Apply 1 application topically 3 (three) times daily. Right knee  . metoprolol succinate (TOPROL-XL) 100 MG 24 hr tablet Take 100 mg by mouth daily.  . mirtazapine (REMERON) 7.5 MG tablet Take 7.5 mg by mouth at bedtime.  Marland Kitchen omeprazole (PRILOSEC) 20 MG capsule Take 20 mg by mouth daily.  . ondansetron (ZOFRAN) 4 MG tablet Take 4 mg by mouth every 4 (four) hours as needed for nausea.   . polyethylene glycol (MIRALAX / GLYCOLAX) packet Take 17  g by mouth 2 (two) times daily.   . sennosides-docusate sodium (SENOKOT-S) 8.6-50 MG tablet Take 2 tablets by mouth 2 (two) times daily.  . traZODone (DESYREL) 50 MG tablet Take 25 mg by mouth at bedtime.  . vitamin C (ASCORBIC ACID) 500 MG tablet Take 500 mg by mouth daily.   No facility-administered encounter medications on file as of 07/05/2019.    PHYSICAL EXAM:   General: NAD, frail appearing, well nourished Cardiovascular: regular rate and rhythm Pulmonary: diminished breath sounds but unlabored respirations Abdomen: soft, nontender, + bowel sounds GU: no suprapubic tenderness Extremities: no edema, no joint deformities Skin:dry  Neurological: Weakness, A&O x3  Gonzella Lex, NP-C

## 2019-07-06 ENCOUNTER — Other Ambulatory Visit: Payer: Self-pay

## 2019-09-16 DEATH — deceased
